# Patient Record
Sex: Female | Born: 1945 | Race: White | Hispanic: No | Smoking: Former smoker
Health system: Southern US, Community
[De-identification: ages and names within clinical notes are randomized; demographics above are authoritative.]

## PROBLEM LIST (undated history)

## (undated) DIAGNOSIS — M1990 Unspecified osteoarthritis, unspecified site: Secondary | ICD-10-CM

## (undated) DIAGNOSIS — G709 Myoneural disorder, unspecified: Secondary | ICD-10-CM

## (undated) DIAGNOSIS — L900 Lichen sclerosus et atrophicus: Secondary | ICD-10-CM

## (undated) DIAGNOSIS — R519 Headache, unspecified: Secondary | ICD-10-CM

## (undated) DIAGNOSIS — R112 Nausea with vomiting, unspecified: Secondary | ICD-10-CM

## (undated) DIAGNOSIS — Z9889 Other specified postprocedural states: Secondary | ICD-10-CM

## (undated) HISTORY — PX: TUBAL LIGATION: SHX77

## (undated) HISTORY — DX: Lichen sclerosus et atrophicus: L90.0

## (undated) HISTORY — PX: DILATION AND CURETTAGE OF UTERUS: SHX78

## (undated) HISTORY — PX: TONSILLECTOMY: SUR1361

## (undated) HISTORY — PX: EYE SURGERY: SHX253

---

## 2000-07-10 ENCOUNTER — Ambulatory Visit (HOSPITAL_COMMUNITY): Admission: RE | Admit: 2000-07-10 | Discharge: 2000-07-10 | Payer: Self-pay | Admitting: *Deleted

## 2000-07-10 ENCOUNTER — Encounter: Payer: Self-pay | Admitting: *Deleted

## 2001-07-18 ENCOUNTER — Ambulatory Visit (HOSPITAL_COMMUNITY): Admission: RE | Admit: 2001-07-18 | Discharge: 2001-07-18 | Payer: Self-pay | Admitting: *Deleted

## 2001-07-18 ENCOUNTER — Encounter: Payer: Self-pay | Admitting: *Deleted

## 2002-07-21 ENCOUNTER — Ambulatory Visit (HOSPITAL_COMMUNITY): Admission: RE | Admit: 2002-07-21 | Discharge: 2002-07-21 | Payer: Self-pay | Admitting: *Deleted

## 2002-07-21 ENCOUNTER — Encounter: Payer: Self-pay | Admitting: *Deleted

## 2002-12-08 ENCOUNTER — Ambulatory Visit (HOSPITAL_COMMUNITY): Admission: RE | Admit: 2002-12-08 | Discharge: 2002-12-08 | Payer: Self-pay | Admitting: Family Medicine

## 2002-12-10 ENCOUNTER — Ambulatory Visit (HOSPITAL_COMMUNITY): Admission: RE | Admit: 2002-12-10 | Discharge: 2002-12-10 | Payer: Self-pay | Admitting: Family Medicine

## 2003-02-03 ENCOUNTER — Other Ambulatory Visit: Admission: RE | Admit: 2003-02-03 | Discharge: 2003-02-03 | Payer: Self-pay | Admitting: *Deleted

## 2003-07-22 ENCOUNTER — Ambulatory Visit (HOSPITAL_COMMUNITY): Admission: RE | Admit: 2003-07-22 | Discharge: 2003-07-22 | Payer: Self-pay | Admitting: *Deleted

## 2004-07-25 ENCOUNTER — Ambulatory Visit (HOSPITAL_COMMUNITY): Admission: RE | Admit: 2004-07-25 | Discharge: 2004-07-25 | Payer: Self-pay | Admitting: *Deleted

## 2004-08-17 ENCOUNTER — Ambulatory Visit (HOSPITAL_COMMUNITY): Admission: RE | Admit: 2004-08-17 | Discharge: 2004-08-17 | Payer: Self-pay | Admitting: *Deleted

## 2005-12-28 ENCOUNTER — Ambulatory Visit: Payer: Self-pay | Admitting: Internal Medicine

## 2005-12-28 ENCOUNTER — Encounter (INDEPENDENT_AMBULATORY_CARE_PROVIDER_SITE_OTHER): Payer: Self-pay | Admitting: Specialist

## 2005-12-28 ENCOUNTER — Ambulatory Visit (HOSPITAL_COMMUNITY): Admission: RE | Admit: 2005-12-28 | Discharge: 2005-12-28 | Payer: Self-pay | Admitting: Internal Medicine

## 2006-02-28 ENCOUNTER — Ambulatory Visit (HOSPITAL_COMMUNITY): Admission: RE | Admit: 2006-02-28 | Discharge: 2006-02-28 | Payer: Self-pay | Admitting: Internal Medicine

## 2006-04-24 ENCOUNTER — Ambulatory Visit (HOSPITAL_COMMUNITY): Admission: RE | Admit: 2006-04-24 | Discharge: 2006-04-24 | Payer: Self-pay | Admitting: Internal Medicine

## 2006-05-02 ENCOUNTER — Ambulatory Visit (HOSPITAL_COMMUNITY): Admission: RE | Admit: 2006-05-02 | Discharge: 2006-05-02 | Payer: Self-pay | Admitting: Internal Medicine

## 2007-03-08 ENCOUNTER — Ambulatory Visit (HOSPITAL_COMMUNITY): Admission: RE | Admit: 2007-03-08 | Discharge: 2007-03-08 | Payer: Self-pay | Admitting: Internal Medicine

## 2007-05-15 ENCOUNTER — Ambulatory Visit (HOSPITAL_COMMUNITY): Admission: RE | Admit: 2007-05-15 | Discharge: 2007-05-15 | Payer: Self-pay | Admitting: Neurosurgery

## 2007-05-20 ENCOUNTER — Encounter (HOSPITAL_COMMUNITY): Admission: RE | Admit: 2007-05-20 | Discharge: 2007-06-19 | Payer: Self-pay | Admitting: Neurosurgery

## 2007-06-18 ENCOUNTER — Encounter: Admission: RE | Admit: 2007-06-18 | Discharge: 2007-06-18 | Payer: Self-pay | Admitting: Neurosurgery

## 2008-03-09 ENCOUNTER — Ambulatory Visit (HOSPITAL_COMMUNITY): Admission: RE | Admit: 2008-03-09 | Discharge: 2008-03-09 | Payer: Self-pay | Admitting: Internal Medicine

## 2008-09-16 ENCOUNTER — Ambulatory Visit (HOSPITAL_COMMUNITY): Admission: RE | Admit: 2008-09-16 | Discharge: 2008-09-16 | Payer: Self-pay | Admitting: Neurosurgery

## 2009-03-11 ENCOUNTER — Ambulatory Visit (HOSPITAL_COMMUNITY): Admission: RE | Admit: 2009-03-11 | Discharge: 2009-03-11 | Payer: Self-pay | Admitting: Internal Medicine

## 2010-02-13 ENCOUNTER — Encounter: Payer: Self-pay | Admitting: Pulmonary Disease

## 2010-02-14 ENCOUNTER — Encounter: Payer: Self-pay | Admitting: Neurosurgery

## 2010-03-02 ENCOUNTER — Other Ambulatory Visit (HOSPITAL_COMMUNITY): Payer: Self-pay | Admitting: Internal Medicine

## 2010-03-02 DIAGNOSIS — Z139 Encounter for screening, unspecified: Secondary | ICD-10-CM

## 2010-03-14 ENCOUNTER — Ambulatory Visit (HOSPITAL_COMMUNITY): Payer: Medicare Other

## 2010-03-14 ENCOUNTER — Ambulatory Visit (HOSPITAL_COMMUNITY)
Admission: RE | Admit: 2010-03-14 | Discharge: 2010-03-14 | Disposition: A | Discharge status: Home / Self Care | Payer: Medicare Other | Source: Ambulatory Visit | Attending: Internal Medicine | Admitting: Internal Medicine

## 2010-03-14 DIAGNOSIS — Z1231 Encounter for screening mammogram for malignant neoplasm of breast: Secondary | ICD-10-CM | POA: Insufficient documentation

## 2010-03-14 DIAGNOSIS — Z139 Encounter for screening, unspecified: Secondary | ICD-10-CM

## 2010-03-16 ENCOUNTER — Other Ambulatory Visit (HOSPITAL_COMMUNITY): Payer: Self-pay | Admitting: Physical Medicine and Rehabilitation

## 2010-03-16 DIAGNOSIS — M542 Cervicalgia: Secondary | ICD-10-CM

## 2010-03-16 DIAGNOSIS — M545 Low back pain, unspecified: Secondary | ICD-10-CM

## 2010-03-21 ENCOUNTER — Ambulatory Visit (HOSPITAL_COMMUNITY): Payer: Medicare Other

## 2010-03-28 ENCOUNTER — Ambulatory Visit (HOSPITAL_COMMUNITY): Payer: Medicare Other

## 2010-03-31 ENCOUNTER — Ambulatory Visit (HOSPITAL_COMMUNITY): Payer: Medicare Other

## 2010-04-07 ENCOUNTER — Ambulatory Visit (HOSPITAL_COMMUNITY)
Admission: RE | Admit: 2010-04-07 | Discharge: 2010-04-07 | Disposition: A | Discharge status: Home / Self Care | Payer: Medicare Other | Source: Ambulatory Visit | Attending: Physical Medicine and Rehabilitation | Admitting: Physical Medicine and Rehabilitation

## 2010-04-07 DIAGNOSIS — M542 Cervicalgia: Secondary | ICD-10-CM | POA: Insufficient documentation

## 2010-04-07 DIAGNOSIS — M545 Low back pain, unspecified: Secondary | ICD-10-CM

## 2010-04-07 DIAGNOSIS — M5030 Other cervical disc degeneration, unspecified cervical region: Secondary | ICD-10-CM | POA: Insufficient documentation

## 2010-04-07 DIAGNOSIS — R209 Unspecified disturbances of skin sensation: Secondary | ICD-10-CM | POA: Insufficient documentation

## 2010-04-07 DIAGNOSIS — M5380 Other specified dorsopathies, site unspecified: Secondary | ICD-10-CM | POA: Insufficient documentation

## 2010-06-10 NOTE — Op Note (Signed)
NAMEVALLORIE, NICCOLI                 ACCOUNT NO.:  [id number]   MEDICAL RECORD NO.:  [id number]          PATIENT TYPE:  AMB   LOCATION:  DAY                           FACILITY:  [facility]   PHYSICIAN:  R. Roetta Sessions, M.D. DATE OF BIRTH:  [birth year]   DATE OF PROCEDURE:  12/28/2005  DATE OF DISCHARGE:                               OPERATIVE REPORT   PROCEDURE:  Colonoscopy with biopsy.   INDICATIONS FOR PROCEDURE:  Patient is a 65 year old lady devoid of any  lower GI tract symptoms, sent over courtesy of Dr. Ouida Sills for colorectal  cancer screening.  She has never had her lower GI tract imaged.   FAMILY HISTORY:  Significant in that her father passed away with  colorectal cancer on his early 39s.  Colonoscopy is now being done as  high risk screening maneuvers.  This approach has discussed the patient  at length.  Potential risks, benefits and was have been reviewed,  questions answered.  Agreeable. Please see documentation in the medical  record.   PROCEDURE NOTE:  O2 saturation, blood pressure, pulse, respirations  monitored throughout the entire procedure.   CONSCIOUS SEDATION:  Versed 4 mg IV, Demerol 75 mg IV divided doses.   INSTRUMENT:  Olympus video chip system, adult and pediatric scope.   FINDINGS:  Digital rectal exam revealed no abnormalities.   ENDOSCOPIC FINDINGS:  The prep was adequate.  Colon:  Colonic mucosa was surveyed from rectosigmoid junction through  the left, transverse, right colon to area appendiceal orifice, ileocecal  valve and cecum.  These structures well seen photographed for the  record.  From this level scope was withdrawn.  All previously mentioned  mucosal surfaces were again seen.  I initially encountered an acute  flexure in the sigmoid colon, which I could not overcome with the adult  scope.  I obtained a pediatric scope and was easily able to advance  scope to the cecum.  The colonic mucosa appeared normal.  Pediatric  scope was pulled down  into the rectum.  Thorough examination of the  rectal mucosa fossa en face and retroflex was undertaken.  The patient  had a couple anal papilla and some internal hemorrhoids.  A diminutive 3  mm polyp at 3 cm in from anal verge was cold biopsied/removed.  Rectal  mucosa appeared normal.  The patient tolerated the procedure well, was  reacted in endoscopy.   IMPRESSION:  Anal papilla and internal hemorrhoids cold  biopsied/removed, remainder of rectal mucosa appeared normal, normal  colonic mucosa.   RECOMMENDATIONS:  1. Follow-up on path.  2. Further recommendations to follow.      Jonathon Bellows, M.D.  Electronically Signed     RMR/MEDQ  D:  12/28/2005  T:  12/28/2005  Job:  [id number]   cc:   Kingsley Callander. Ouida Sills, MD  Fax: [phone]

## 2010-08-11 ENCOUNTER — Encounter (HOSPITAL_COMMUNITY): Payer: Self-pay

## 2010-08-11 ENCOUNTER — Encounter (HOSPITAL_COMMUNITY)
Admission: RE | Admit: 2010-08-11 | Discharge: 2010-08-11 | Disposition: A | Discharge status: Home / Self Care | Payer: Medicare Other | Source: Ambulatory Visit | Attending: Ophthalmology | Admitting: Ophthalmology

## 2010-08-11 ENCOUNTER — Other Ambulatory Visit: Payer: Self-pay

## 2010-08-11 HISTORY — DX: Nausea with vomiting, unspecified: R11.2

## 2010-08-11 HISTORY — DX: Other specified postprocedural states: Z98.890

## 2010-08-11 LAB — CBC
HCT: 37.6 % (ref 36.0–46.0)
Hemoglobin: 12.9 g/dL (ref 12.0–15.0)
MCH: 31.5 pg (ref 26.0–34.0)
MCHC: 34.3 g/dL (ref 30.0–36.0)
MCV: 91.7 fL (ref 78.0–100.0)
Platelets: 255 10*3/uL (ref 150–400)
RBC: 4.1 MIL/uL (ref 3.87–5.11)
RDW: 12.2 % (ref 11.5–15.5)
WBC: 7.2 10*3/uL (ref 4.0–10.5)

## 2010-08-11 LAB — BASIC METABOLIC PANEL
BUN: 7 mg/dL (ref 6–23)
CO2: 26 mEq/L (ref 19–32)
Calcium: 9.8 mg/dL (ref 8.4–10.5)
Chloride: 101 mEq/L (ref 96–112)
Creatinine, Ser: 0.59 mg/dL (ref 0.50–1.10)
GFR calc Af Amer: >60 mL/min (ref >60)
GFR calc non Af Amer: >60 mL/min (ref >60)
Glucose, Bld: 112 mg/dL — ABNORMAL HIGH (ref 70–99)
Potassium: 3.9 mEq/L (ref 3.5–5.1)
Sodium: 136 mEq/L (ref 135–145)

## 2010-08-11 NOTE — Patient Instructions (Signed)
20 Casey Armstrong  08/11/2010   Your procedure is scheduled on: 08/15/10  Report to [facility] at 0900 AM  Call this number if you have problems the morning of surgery: [phone]   Remember:   Do not eat food:After Midnight.  Do not drink clear liquids: After Midnight.  Take these medicines the morning of surgery with A SIP OF WATER: ativan if needed   Do not wear jewelry, make-up or nail polish.  Do not bring valuables to the hospital.  Contacts, dentures or bridgework may not be worn into surgery.  Leave suitcase in the car. After surgery it may be brought to your room.  For patients admitted to the hospital, checkout time is 11:00 AM the day of discharge.   Patients discharged the day of surgery will not be allowed to drive home.  Name and phone number of your driver: family  Special Instructions: N/A   Please read over the following fact sheets that you were given: Pain Booklet and Anesthesia Post-op Instructions PATIENT INSTRUCTIONS POST-ANESTHESIA  IMMEDIATELY FOLLOWING SURGERY:  Do not drive or operate machinery for the first twenty four hours after surgery.  Do not make any important decisions for twenty four hours after surgery or while taking narcotic pain medications or sedatives.  If you develop intractable nausea and vomiting or a severe headache please notify your doctor immediately.  FOLLOW-UP:  Please make an appointment with your surgeon as instructed. You do not need to follow up with anesthesia unless specifically instructed to do so.  WOUND CARE INSTRUCTIONS (if applicable):  Keep a dry clean dressing on the anesthesia/puncture wound site if there is drainage.  Once the wound has quit draining you may leave it open to air.  Generally you should leave the bandage intact for twenty four hours unless there is drainage.  If the epidural site drains for more than 36-48 hours please call the anesthesia department.  QUESTIONS?:  Please feel free to call your physician or the  hospital operator if you have any questions, and they will be happy to assist you.     [facility] Anesthesia Department [address] [place] [phone]

## 2010-08-15 ENCOUNTER — Ambulatory Visit (HOSPITAL_COMMUNITY): Payer: Medicare Other | Admitting: Anesthesiology

## 2010-08-15 ENCOUNTER — Encounter (HOSPITAL_COMMUNITY): Payer: Self-pay | Admitting: Anesthesiology

## 2010-08-15 ENCOUNTER — Ambulatory Visit (HOSPITAL_COMMUNITY)
Admission: RE | Admit: 2010-08-15 | Discharge: 2010-08-15 | Disposition: A | Discharge status: Home / Self Care | Payer: Medicare Other | Source: Ambulatory Visit | Attending: Ophthalmology | Admitting: Ophthalmology

## 2010-08-15 ENCOUNTER — Encounter (HOSPITAL_COMMUNITY)
Admission: RE | Disposition: A | Discharge status: Home / Self Care | Payer: Self-pay | Source: Ambulatory Visit | Attending: Ophthalmology

## 2010-08-15 DIAGNOSIS — H2510 Age-related nuclear cataract, unspecified eye: Secondary | ICD-10-CM | POA: Insufficient documentation

## 2010-08-15 DIAGNOSIS — Z01818 Encounter for other preprocedural examination: Secondary | ICD-10-CM | POA: Insufficient documentation

## 2010-08-15 HISTORY — PX: CATARACT EXTRACTION W/PHACO: SHX586

## 2010-08-15 SURGERY — PHACOEMULSIFICATION, CATARACT, WITH IOL INSERTION
Anesthesia: Monitor Anesthesia Care | Site: Eye | Laterality: Left | Wound class: Clean

## 2010-08-15 MED ORDER — CYCLOPENTOLATE-PHENYLEPHRINE 0.2-1 % OP SOLN
1.0000 [drp] | OPHTHALMIC | Status: AC
  Administered 2010-08-15 (×3): 1 [drp] via OPHTHALMIC

## 2010-08-15 MED ORDER — MIDAZOLAM HCL 2 MG/2ML IJ SOLN: 1.0000 mg | INTRAMUSCULAR | Status: DC | PRN

## 2010-08-15 MED ORDER — TETRACAINE HCL 0.5 % OP SOLN
OPHTHALMIC | Status: AC
  Administered 2010-08-15: 1 [drp] via OPHTHALMIC
  Filled 2010-08-15: qty 2

## 2010-08-15 MED ORDER — PHENYLEPHRINE HCL 2.5 % OP SOLN
1.0000 [drp] | OPHTHALMIC | Status: AC
  Administered 2010-08-15 (×3): 1 [drp] via OPHTHALMIC

## 2010-08-15 MED ORDER — PROVISC 10 MG/ML IO SOLN
INTRAOCULAR | Status: DC | PRN
  Administered 2010-08-15: 8.5 mg via OPHTHALMIC

## 2010-08-15 MED ORDER — EPINEPHRINE HCL 1 MG/ML IJ SOLN
INTRAOCULAR | Status: DC | PRN
  Administered 2010-08-15: 11:00:00

## 2010-08-15 MED ORDER — LACTATED RINGERS IV SOLN
INTRAVENOUS | Status: DC
  Administered 2010-08-15: 50 mL via INTRAVENOUS

## 2010-08-15 MED ORDER — NEOMYCIN-POLYMYXIN-DEXAMETH 0.1 % OP OINT
TOPICAL_OINTMENT | OPHTHALMIC | Status: DC | PRN
  Administered 2010-08-15: 1 via OPHTHALMIC

## 2010-08-15 MED ORDER — EPINEPHRINE HCL 1 MG/ML IJ SOLN
INTRAMUSCULAR | Status: AC
  Filled 2010-08-15: qty 1

## 2010-08-15 MED ORDER — LIDOCAINE HCL (PF) 1 % IJ SOLN
INTRAMUSCULAR | Status: AC
  Filled 2010-08-15: qty 2

## 2010-08-15 MED ORDER — LIDOCAINE HCL (PF) 1 % IJ SOLN
INTRAMUSCULAR | Status: DC | PRN
  Administered 2010-08-15: .2 mL

## 2010-08-15 MED ORDER — TETRACAINE HCL 0.5 % OP SOLN
1.0000 [drp] | OPHTHALMIC | Status: AC
  Administered 2010-08-15 (×3): 1 [drp] via OPHTHALMIC

## 2010-08-15 MED ORDER — NEOMYCIN-POLYMYXIN-DEXAMETH 3.5-10000-0.1 OP OINT
TOPICAL_OINTMENT | OPHTHALMIC | Status: AC
  Filled 2010-08-15: qty 3.5

## 2010-08-15 MED ORDER — POVIDONE-IODINE 5 % OP SOLN
OPHTHALMIC | Status: DC | PRN
  Administered 2010-08-15: 1 via OPHTHALMIC

## 2010-08-15 MED ORDER — CYCLOPENTOLATE-PHENYLEPHRINE 0.2-1 % OP SOLN
OPHTHALMIC | Status: AC
  Administered 2010-08-15: 1 [drp] via OPHTHALMIC
  Filled 2010-08-15: qty 2

## 2010-08-15 MED ORDER — LACTATED RINGERS IV SOLN
INTRAVENOUS | Status: DC | PRN
  Administered 2010-08-15: 11:00:00 via INTRAVENOUS

## 2010-08-15 MED ORDER — LIDOCAINE HCL 3.5 % OP GEL
OPHTHALMIC | Status: DC | PRN
  Administered 2010-08-15: 1 via OPHTHALMIC

## 2010-08-15 MED ORDER — LIDOCAINE HCL 3.5 % OP GEL
OPHTHALMIC | Status: AC
  Filled 2010-08-15: qty 5

## 2010-08-15 MED ORDER — LIDOCAINE HCL 3.5 % OP GEL: 1.0000 "application " | Freq: Once | OPHTHALMIC | Status: DC

## 2010-08-15 MED ORDER — MIDAZOLAM HCL 2 MG/2ML IJ SOLN
INTRAMUSCULAR | Status: AC
  Filled 2010-08-15: qty 2

## 2010-08-15 MED ORDER — BSS IO SOLN
INTRAOCULAR | Status: DC | PRN
  Administered 2010-08-15: 15 mL via OPHTHALMIC

## 2010-08-15 MED ORDER — PHENYLEPHRINE HCL 2.5 % OP SOLN
OPHTHALMIC | Status: AC
  Administered 2010-08-15: 1 [drp] via OPHTHALMIC
  Filled 2010-08-15: qty 2

## 2010-08-15 SURGICAL SUPPLY — 32 items
CAPSULAR TENSION RING-AMO (OPHTHALMIC RELATED) IMPLANT
CLOTH BEACON ORANGE TIMEOUT ST (SAFETY) ×1 IMPLANT
DUOVISC SYSTEM (INTRAOCULAR LENS)
EYE SHIELD UNIVERSAL CLEAR (GAUZE/BANDAGES/DRESSINGS) ×1 IMPLANT
GLOVE BIO SURGEON STRL SZ 6.5 (GLOVE) IMPLANT
GLOVE BIOGEL PI IND STRL 6.5 (GLOVE) IMPLANT
GLOVE BIOGEL PI IND STRL 7.0 (GLOVE) IMPLANT
GLOVE BIOGEL PI IND STRL 7.5 (GLOVE) IMPLANT
GLOVE BIOGEL PI INDICATOR 6.5 (GLOVE)
GLOVE BIOGEL PI INDICATOR 7.0 (GLOVE) ×1
GLOVE BIOGEL PI INDICATOR 7.5 (GLOVE)
GLOVE ECLIPSE 6.5 STRL STRAW (GLOVE) IMPLANT
GLOVE ECLIPSE 7.0 STRL STRAW (GLOVE) IMPLANT
GLOVE ECLIPSE 7.5 STRL STRAW (GLOVE) IMPLANT
GLOVE EXAM NITRILE LRG STRL (GLOVE) IMPLANT
GLOVE EXAM NITRILE MD LF STRL (GLOVE) ×1 IMPLANT
GLOVE SKINSENSE NS SZ6.5 (GLOVE)
GLOVE SKINSENSE NS SZ7.0 (GLOVE)
GLOVE SKINSENSE STRL SZ6.5 (GLOVE) IMPLANT
GLOVE SKINSENSE STRL SZ7.0 (GLOVE) IMPLANT
KIT VITRECTOMY (OPHTHALMIC RELATED) IMPLANT
PAD ARMBOARD 7.5X6 YLW CONV (MISCELLANEOUS) ×1 IMPLANT
PROC W NO LENS (INTRAOCULAR LENS)
PROC W SPEC LENS (INTRAOCULAR LENS)
PROCESS W NO LENS (INTRAOCULAR LENS) IMPLANT
PROCESS W SPEC LENS (INTRAOCULAR LENS) IMPLANT
RING MALYGIN (MISCELLANEOUS) IMPLANT
SIGHTPATH CAT PROC W REG LENS (Ophthalmic Related) ×2 IMPLANT
SYR TB 1ML LL NO SAFETY (SYRINGE) ×1 IMPLANT
SYSTEM DUOVISC (INTRAOCULAR LENS) IMPLANT
VISCOELASTIC ADDITIONAL (OPHTHALMIC RELATED) IMPLANT
WATER STERILE IRR 250ML POUR (IV SOLUTION) ×1 IMPLANT

## 2010-08-15 NOTE — H&P (Signed)
I have evaluated the patient preoperatively, and have identified no interval changes in medical condition and plan of care since the history and physical of record 

## 2010-08-15 NOTE — Transfer of Care (Signed)
Immediate Anesthesia Transfer of Care Note  Patient: Casey Armstrong  Procedure(s) Performed:  CATARACT EXTRACTION PHACO AND INTRAOCULAR LENS PLACEMENT (IOC) - CDE 14.67  Patient Location: Short Stay  Anesthesia Type: MAC  Level of Consciousness: awake  Airway & Oxygen Therapy: Patient Spontanous Breathing  Post-op Assessment: Report given to PACU RN  Post vital signs: stable  Complications: No apparent anesthesia complications

## 2010-08-15 NOTE — Anesthesia Preprocedure Evaluation (Addendum)
Anesthesia Evaluation  Name, MR# and DOB Patient awake  General Assessment Comment  Reviewed: Allergy & Precautions and H&P   History of Anesthesia Complications (+) PONV  Airway Mallampati: I  Neck ROM: Full    Dental  (+) Teeth Intact   Pulmonary  clear to auscultation    Cardiovascular Regular Normal   Neuro/Psych  GI/Hepatic/Renal   Endo/Other   Abdominal   Musculoskeletal  Hematology   Peds  Reproductive/Obstetrics   Anesthesia Other Findings             Anesthesia Physical Anesthesia Plan  ASA: I  Anesthesia Plan: MAC   Post-op Pain Management:    Induction:   Airway Management Planned: Nasal Cannula  Additional Equipment:   Intra-op Plan:   Post-operative Plan:   Informed Consent: I have reviewed the patients History and Physical, chart, labs and discussed the procedure including the risks, benefits and alternatives for the proposed anesthesia with the patient or authorized representative who has indicated his/her understanding and acceptance.     Plan Discussed with:   Anesthesia Plan Comments:         Anesthesia Quick Evaluation

## 2010-08-15 NOTE — Anesthesia Postprocedure Evaluation (Signed)
  Anesthesia Post-op Note  Patient: Casey Armstrong  Procedure(s) Performed:  CATARACT EXTRACTION PHACO AND INTRAOCULAR LENS PLACEMENT (IOC) - CDE 14.67  Patient Location: PACU and Short Stay  Anesthesia Type: MAC  Level of Consciousness: awake, alert  and oriented  Airway and Oxygen Therapy: Patient Spontanous Breathing  Post-op Pain: none  Post-op Assessment: Post-op Vital signs reviewed, Patient's Cardiovascular Status Stable and Respiratory Function Stable  Post-op Vital Signs: stable  Complications: No apparent anesthesia complications

## 2010-08-15 NOTE — Brief Op Note (Signed)
08/15/2010  11:43 AM  PATIENT:  Casey Armstrong  65 y.o. female  PRE-OPERATIVE DIAGNOSIS:  nuclear cataract left eye  POST-OPERATIVE DIAGNOSIS:  nuclear cataract left eye  PROCEDURE:  Procedure(s): CATARACT EXTRACTION PHACO AND INTRAOCULAR LENS PLACEMENT (IOC)  SURGEON:  Surgeon(s): Gemma Payor  ANESTHESIA:   local and IV sedation

## 2010-08-16 NOTE — Op Note (Signed)
NAMELORMA, HEATER                 ACCOUNT NO.:  [id number]  MEDICAL RECORD NO.:  [id number]  LOCATION:  APPO                          FACILITY:  [facility]  PHYSICIAN:  Susanne Greenhouse, MD       DATE OF BIRTH:  April 08, 1945  DATE OF PROCEDURE:  08/15/2010 DATE OF DISCHARGE:  08/15/2010                              OPERATIVE REPORT   PREOPERATIVE DIAGNOSIS:  Nuclear cataract, left eye; diagnosis code 366.16.  POSTOPERATIVE DIAGNOSIS:  Nuclear cataract, left eye; diagnosis code 366.16.  PROCEDURE: PHACOEMULSIFICATION WITH IOL OS.  SPECIMENS:  No surgical specimens.  PROSTHETIC DEVICE USED:  Lenstec posterior chamber lens, model Softec HD, power of 29.0, serial number is [id number].          ______________________________ Susanne Greenhouse, MD     KEH/MEDQ  D:  08/15/2010  T:  08/15/2010  Job:  [id number]

## 2010-08-23 ENCOUNTER — Encounter (HOSPITAL_COMMUNITY): Payer: Self-pay | Admitting: *Deleted

## 2010-08-23 ENCOUNTER — Ambulatory Visit (HOSPITAL_COMMUNITY)
Admission: RE | Admit: 2010-08-23 | Discharge: 2010-08-23 | Payer: Medicare Other | Source: Ambulatory Visit | Attending: Ophthalmology | Admitting: Ophthalmology

## 2010-08-23 MED ORDER — LACTATED RINGERS IV SOLN: INTRAVENOUS | Status: DC

## 2010-08-25 ENCOUNTER — Ambulatory Visit (HOSPITAL_COMMUNITY): Payer: Medicare Other | Admitting: Anesthesiology

## 2010-08-25 ENCOUNTER — Encounter (HOSPITAL_COMMUNITY): Payer: Self-pay | Admitting: Anesthesiology

## 2010-08-25 ENCOUNTER — Encounter (HOSPITAL_COMMUNITY): Payer: Self-pay | Admitting: *Deleted

## 2010-08-25 ENCOUNTER — Encounter (HOSPITAL_COMMUNITY)
Admission: RE | Disposition: A | Discharge status: Home / Self Care | Payer: Self-pay | Source: Ambulatory Visit | Attending: Ophthalmology

## 2010-08-25 ENCOUNTER — Ambulatory Visit (HOSPITAL_COMMUNITY)
Admission: RE | Admit: 2010-08-25 | Discharge: 2010-08-25 | Disposition: A | Discharge status: Home / Self Care | Payer: Medicare Other | Source: Ambulatory Visit | Attending: Ophthalmology | Admitting: Ophthalmology

## 2010-08-25 DIAGNOSIS — H2510 Age-related nuclear cataract, unspecified eye: Secondary | ICD-10-CM | POA: Insufficient documentation

## 2010-08-25 HISTORY — PX: CATARACT EXTRACTION W/PHACO: SHX586

## 2010-08-25 SURGERY — PHACOEMULSIFICATION, CATARACT, WITH IOL INSERTION
Anesthesia: Monitor Anesthesia Care | Site: Eye | Laterality: Right | Wound class: Clean

## 2010-08-25 MED ORDER — CYCLOPENTOLATE-PHENYLEPHRINE 0.2-1 % OP SOLN: 1.0000 [drp] | OPHTHALMIC | Status: DC

## 2010-08-25 MED ORDER — MIDAZOLAM HCL 2 MG/2ML IJ SOLN
INTRAMUSCULAR | Status: AC
  Filled 2010-08-25: qty 2

## 2010-08-25 MED ORDER — CYCLOPENTOLATE-PHENYLEPHRINE 0.2-1 % OP SOLN
OPHTHALMIC | Status: AC
  Administered 2010-08-25: 1 [drp] via OPHTHALMIC
  Filled 2010-08-25: qty 2

## 2010-08-25 MED ORDER — LIDOCAINE HCL 3.5 % OP GEL: 1.0000 "application " | Freq: Once | OPHTHALMIC | Status: DC

## 2010-08-25 MED ORDER — EPINEPHRINE HCL 1 MG/ML IJ SOLN
INTRAMUSCULAR | Status: DC | PRN
  Administered 2010-08-25: 10:00:00

## 2010-08-25 MED ORDER — LIDOCAINE HCL (PF) 1 % IJ SOLN
INTRAMUSCULAR | Status: DC | PRN
  Administered 2010-08-25: .2 mL

## 2010-08-25 MED ORDER — TETRACAINE HCL 0.5 % OP SOLN
OPHTHALMIC | Status: AC
  Administered 2010-08-25: 1 [drp] via OPHTHALMIC
  Filled 2010-08-25: qty 2

## 2010-08-25 MED ORDER — LIDOCAINE HCL 3.5 % OP GEL
OPHTHALMIC | Status: AC
  Administered 2010-08-25: 1 via OPHTHALMIC
  Filled 2010-08-25: qty 5

## 2010-08-25 MED ORDER — NEOMYCIN-POLYMYXIN-DEXAMETH 0.1 % OP OINT
TOPICAL_OINTMENT | OPHTHALMIC | Status: DC | PRN
  Administered 2010-08-25: 1 via OPHTHALMIC

## 2010-08-25 MED ORDER — TETRACAINE HCL 0.5 % OP SOLN: 1.0000 [drp] | OPHTHALMIC | Status: DC

## 2010-08-25 MED ORDER — TETRACAINE HCL 0.5 % OP SOLN
1.0000 [drp] | OPHTHALMIC | Status: AC
  Administered 2010-08-25 (×3): 1 [drp] via OPHTHALMIC

## 2010-08-25 MED ORDER — PHENYLEPHRINE HCL 2.5 % OP SOLN
OPHTHALMIC | Status: AC
  Administered 2010-08-25: 1 [drp] via OPHTHALMIC
  Filled 2010-08-25: qty 2

## 2010-08-25 MED ORDER — PHENYLEPHRINE HCL 2.5 % OP SOLN
1.0000 [drp] | OPHTHALMIC | Status: AC
  Administered 2010-08-25 (×3): 1 [drp] via OPHTHALMIC

## 2010-08-25 MED ORDER — LIDOCAINE HCL (PF) 1 % IJ SOLN
INTRAMUSCULAR | Status: AC
  Filled 2010-08-25: qty 2

## 2010-08-25 MED ORDER — POVIDONE-IODINE 5 % OP SOLN
OPHTHALMIC | Status: DC | PRN
  Administered 2010-08-25: 1 via OPHTHALMIC

## 2010-08-25 MED ORDER — LACTATED RINGERS IV SOLN
INTRAVENOUS | Status: DC | PRN
  Administered 2010-08-25: 09:00:00 via INTRAVENOUS

## 2010-08-25 MED ORDER — BSS IO SOLN
INTRAOCULAR | Status: DC | PRN
  Administered 2010-08-25: 15 mL via OPHTHALMIC

## 2010-08-25 MED ORDER — LIDOCAINE HCL 3.5 % OP GEL
1.0000 "application " | Freq: Once | OPHTHALMIC | Status: AC
  Administered 2010-08-25: 1 via OPHTHALMIC

## 2010-08-25 MED ORDER — CYCLOPENTOLATE-PHENYLEPHRINE 0.2-1 % OP SOLN
1.0000 [drp] | OPHTHALMIC | Status: AC
  Administered 2010-08-25 (×3): 1 [drp] via OPHTHALMIC

## 2010-08-25 MED ORDER — PHENYLEPHRINE HCL 2.5 % OP SOLN: 1.0000 [drp] | OPHTHALMIC | Status: DC

## 2010-08-25 MED ORDER — NEOMYCIN-POLYMYXIN-DEXAMETH 3.5-10000-0.1 OP OINT
TOPICAL_OINTMENT | OPHTHALMIC | Status: AC
  Filled 2010-08-25: qty 3.5

## 2010-08-25 MED ORDER — MIDAZOLAM HCL 2 MG/2ML IJ SOLN
1.0000 mg | INTRAMUSCULAR | Status: DC | PRN
Start: 2010-08-25 — End: 2010-08-25
  Administered 2010-08-25: 2 mg via INTRAVENOUS

## 2010-08-25 MED ORDER — PROVISC 10 MG/ML IO SOLN
INTRAOCULAR | Status: DC | PRN
  Administered 2010-08-25: 8.5 mg via OPHTHALMIC

## 2010-08-25 MED ORDER — LIDOCAINE HCL 3.5 % OP GEL
OPHTHALMIC | Status: DC | PRN
  Administered 2010-08-25: 1 via OPHTHALMIC

## 2010-08-25 SURGICAL SUPPLY — 34 items
CAPSULAR TENSION RING-AMO (OPHTHALMIC RELATED) IMPLANT
CLOTH BEACON ORANGE TIMEOUT ST (SAFETY) ×1 IMPLANT
DUOVISC SYSTEM (INTRAOCULAR LENS)
EYE SHIELD UNIVERSAL CLEAR (GAUZE/BANDAGES/DRESSINGS) ×1 IMPLANT
GLOVE BIO SURGEON STRL SZ 6.5 (GLOVE) IMPLANT
GLOVE BIOGEL PI IND STRL 6.5 (GLOVE) IMPLANT
GLOVE BIOGEL PI IND STRL 7.0 (GLOVE) IMPLANT
GLOVE BIOGEL PI IND STRL 7.5 (GLOVE) IMPLANT
GLOVE BIOGEL PI INDICATOR 6.5 (GLOVE) ×1
GLOVE BIOGEL PI INDICATOR 7.0 (GLOVE)
GLOVE BIOGEL PI INDICATOR 7.5 (GLOVE)
GLOVE ECLIPSE 6.5 STRL STRAW (GLOVE) IMPLANT
GLOVE ECLIPSE 7.0 STRL STRAW (GLOVE) IMPLANT
GLOVE ECLIPSE 7.5 STRL STRAW (GLOVE) IMPLANT
GLOVE EXAM NITRILE LRG STRL (GLOVE) ×1 IMPLANT
GLOVE EXAM NITRILE MD LF STRL (GLOVE) IMPLANT
GLOVE SKINSENSE NS SZ6.5 (GLOVE)
GLOVE SKINSENSE NS SZ7.0 (GLOVE)
GLOVE SKINSENSE STRL SZ6.5 (GLOVE) IMPLANT
GLOVE SKINSENSE STRL SZ7.0 (GLOVE) IMPLANT
KIT VITRECTOMY (OPHTHALMIC RELATED) IMPLANT
PAD ARMBOARD 7.5X6 YLW CONV (MISCELLANEOUS) ×1 IMPLANT
PROC W NO LENS (INTRAOCULAR LENS)
PROC W SPEC LENS (INTRAOCULAR LENS)
PROCESS W NO LENS (INTRAOCULAR LENS) IMPLANT
PROCESS W SPEC LENS (INTRAOCULAR LENS) IMPLANT
RING MALYGIN (MISCELLANEOUS) IMPLANT
SIGHTPATH CAT PROC W REG LENS (Ophthalmic Related) ×2 IMPLANT
SYR TB 1ML LL NO SAFETY (SYRINGE) ×1 IMPLANT
SYSTEM DUOVISC (INTRAOCULAR LENS) IMPLANT
TAPE SURG TRANSPORE 1 IN (GAUZE/BANDAGES/DRESSINGS) IMPLANT
TAPE SURGICAL TRANSPORE 1 IN (GAUZE/BANDAGES/DRESSINGS) ×1
VISCOELASTIC ADDITIONAL (OPHTHALMIC RELATED) IMPLANT
WATER STERILE IRR 250ML POUR (IV SOLUTION) ×1 IMPLANT

## 2010-08-25 NOTE — Op Note (Signed)
NAMEASHANI, PUMPHREY                 ACCOUNT NO.:  [id number]  MEDICAL RECORD NO.:  [id number]  LOCATION:  APPO                          FACILITY:  [facility]  PHYSICIAN:  Susanne Greenhouse, MD       DATE OF BIRTH:  03-08-1945  DATE OF PROCEDURE:  08/25/2010 DATE OF DISCHARGE:  08/25/2010                              OPERATIVE REPORT   PREOPERATIVE DIAGNOSIS:  Nuclear cataract, right eye.  Diagnosis code 366.16.  POSTOPERATIVE DIAGNOSIS:  Nuclear cataract, right eye.  Diagnosis code 366.16.  OPERATION PERFORMED:  Phacoemulsification with posterior chamber intraocular lens implantation, right eye.  SURGEON:  Susanne Greenhouse, MD  ANESTHESIA:  General endotracheal anesthesia.  OPERATIVE SUMMARY:  In the preoperative area, dilating drops were placed into the right eye.  The patient was then brought into the operating room where she was placed under general anesthesia.  The eye was then prepped and draped.  Beginning with a 75 blade, a paracentesis port was made at the surgeon's 2 o'clock position.  The anterior chamber was then filled with a 1% nonpreserved lidocaine solution with epinephrine.  This was followed by Viscoat to deepen the chamber.  A small fornix-based peritomy was performed superiorly.  Next, a single iris hook was placed through the limbus superiorly.  A 2.4-mm keratome blade was then used to make a clear corneal incision over the iris hook.  A bent cystotome needle and Utrata forceps were used to create a continuous tear capsulotomy.  Hydrodissection was performed using balanced salt solution on a fine cannula.  The lens nucleus was then removed using phacoemulsification in a quadrant cracking technique.  The cortical material was then removed with irrigation and aspiration.  The capsular bag and anterior chamber were refilled with Provisc.  The wound was widened to approximately 3 mm and a posterior chamber intraocular lens was placed into the capsular bag without difficulty  using an [employer] lens injecting system.  A single 10-0 nylon suture was then used to close the incision as well as stromal hydration.  The Provisc was removed from the anterior chamber and capsular bag with irrigation and aspiration.  At this point, the wounds were tested for leak, which were negative.  The anterior chamber remained deep and stable.  The patient tolerated the procedure well.  There were no operative complications, and she awoke from general anesthesia without problem.  No surgical specimens.  PROSTHETIC DEVICE USED:  A Lenstec posterior chamber lens model SofTec HD, power of 29.0, serial number is [id number].          ______________________________ Susanne Greenhouse, MD     KEH/MEDQ  D:  08/25/2010  T:  08/25/2010  Job:  [id number]

## 2010-08-25 NOTE — H&P (Signed)
I have evaluated the patient preoperatively, and have identified no interval changes in medical condition and plan of care since the history and physical of record 

## 2010-08-25 NOTE — Transfer of Care (Signed)
Immediate Anesthesia Transfer of Care Note  Patient: Casey Armstrong  Procedure(s) Performed:  CATARACT EXTRACTION PHACO AND INTRAOCULAR LENS PLACEMENT (IOC) - CDE 12.65  Patient Location: PACU and Short Stay  Anesthesia Type: MAC  Level of Consciousness: awake, alert , oriented and patient cooperative  Airway & Oxygen Therapy: Patient Spontanous Breathing  Post-op Assessment: Report given to PACU RN, Post -op Vital signs reviewed and stable and Patient moving all extremities X 4  Post vital signs: stable  Complications: No apparent anesthesia complications

## 2010-08-25 NOTE — Anesthesia Preprocedure Evaluation (Addendum)
Anesthesia Evaluation  General Assessment Comment  History of Anesthesia Complications (+) PONV  Airway Mallampati: I    Mouth opening: Limited Mouth Opening  Dental  (+) Teeth Intact   Pulmonary    pulmonary exam normal   Cardiovascular    Neuro/Psych  GI/Hepatic/Renal   Endo/Other   Abdominal   Musculoskeletal  Hematology   Peds  Reproductive/Obstetrics   Anesthesia Other Findings             Anesthesia Physical Anesthesia Plan  ASA: II  Anesthesia Plan: MAC   Post-op Pain Management:    Induction:   Airway Management Planned: Nasal Cannula  Additional Equipment:   Intra-op Plan:   Post-operative Plan:   Informed Consent: I have reviewed the patients History and Physical, chart, labs and discussed the procedure including the risks, benefits and alternatives for the proposed anesthesia with the patient or authorized representative who has indicated his/her understanding and acceptance.     Plan Discussed with:   Anesthesia Plan Comments:         Anesthesia Quick Evaluation

## 2010-08-25 NOTE — Anesthesia Postprocedure Evaluation (Signed)
  Anesthesia Post-op Note  Patient: Casey Armstrong  Procedure(s) Performed:  CATARACT EXTRACTION PHACO AND INTRAOCULAR LENS PLACEMENT (IOC) - CDE 12.65  Patient Location: PACU  Anesthesia Type: MAC  Level of Consciousness: awake, alert  and oriented  Airway and Oxygen Therapy: Patient Spontanous Breathing  Post-op Pain: none  Post-op Assessment: Post-op Vital signs reviewed, Patient's Cardiovascular Status Stable and Respiratory Function Stable  Post-op Vital Signs: stable  Complications: No apparent anesthesia complications

## 2010-08-25 NOTE — Brief Op Note (Signed)
08/25/2010  9:58 AM  PATIENT:  Casey Armstrong  65 y.o. female  PRE-OPERATIVE DIAGNOSIS:  nuclear cataract right eye  POST-OPERATIVE DIAGNOSIS:  nuclear cataract right eye  PROCEDURE:  Procedure(s): CATARACT EXTRACTION PHACO AND INTRAOCULAR LENS PLACEMENT (IOC)  SURGEON:  Surgeon(s): Gemma Payor  ANESTHESIA:   local and IV sedation  SPECIMEN:  No Specimen

## 2010-09-05 ENCOUNTER — Encounter (HOSPITAL_COMMUNITY): Payer: Self-pay | Admitting: Ophthalmology

## 2010-11-15 ENCOUNTER — Encounter: Payer: Self-pay | Admitting: Internal Medicine

## 2010-12-20 ENCOUNTER — Telehealth: Payer: Self-pay | Admitting: Internal Medicine

## 2010-12-20 NOTE — Telephone Encounter (Signed)
Pt received a letter from [place] back in Oct 2012 that she was due to have her colonoscopy and may need an OV prior.  She doesn't feel like she needs to be seen in the office and would prefer to be triaged since she said she doesn't have a history of polyps. She did mention that her father died from colon cancer and I told her that for that reason she may need to come in for OV, but she would still rather be triaged via phone and she wants RMR ONLY for OV. I told her that his next available was on Jan 27, 2011. Please advise or call patient to triage.

## 2010-12-21 NOTE — Telephone Encounter (Signed)
I think that we can triag her initially -  if we don't detect any issue,   perhaps we can circumvent an office visit to satisfy the patient

## 2010-12-21 NOTE — Telephone Encounter (Signed)
Routed to DS 

## 2010-12-21 NOTE — Telephone Encounter (Signed)
LMOM to call.

## 2010-12-22 ENCOUNTER — Other Ambulatory Visit: Payer: Self-pay

## 2010-12-22 NOTE — Telephone Encounter (Signed)
Gastroenterology Pre-Procedure Form  Pt called to schedule colonoscopy  Request Date: 12/22/2010        Requesting Physician: Dr. Ouida Sills     PATIENT INFORMATION:  Casey Armstrong is a 65 y.o., female (DOB=May 14, 1945).  PROCEDURE: Procedure(s) requested: colonoscopy Procedure Reason: Hx of polyps  PATIENT REVIEW QUESTIONS: The patient reports the following:   1. Diabetes Melitis: no 2. Joint replacements in the past 12 months: no 3. Major health problems in the past 3 months: no 4. Has an artificial valve or MVP:no 5. Has been advised in past to take antibiotics in advance of a procedure like teeth cleaning: no}    MEDICATIONS & ALLERGIES:    Patient reports the following regarding taking any blood thinners:   Plavix? no Aspirin?no Coumadin?  no  Patient confirms/reports the following medications:  Current Outpatient Prescriptions  Medication Sig Dispense Refill  . acetaminophen (TYLENOL) 325 MG tablet Take 325 mg by mouth every 6 (six) hours as needed. For headaches        . Calcium Carb-Cholecalciferol (CALCIUM 1000 + D) 1000-800 MG-UNIT TABS Take 1 tablet by mouth daily.        . Doxylamine-Phenylephrine-APAP (VICKS NYQUIL SINUS) 6.25-5-325 MG CAPS Take 1 capsule by mouth 3 times/day as needed-between meals & bedtime. For sinuses      . ibuprofen (ADVIL,MOTRIN) 200 MG tablet Take 200 mg by mouth every 6 (six) hours as needed. For headaches       . LORazepam (ATIVAN) 1 MG tablet Take 0.5-1 mg by mouth at bedtime as needed. Sleep      . vitamin B-12 (CYANOCOBALAMIN) 500 MCG tablet Take 500 mcg by mouth daily.         Patient confirms/reports the following allergies:  No Known Allergies  Patient is appropriate to schedule for requested procedure(s): yes  AUTHORIZATION INFORMATION Primary Insurance:   ID #:   Group #:  Pre-Cert / Auth required: Pre-Cert / Auth #:   Secondary Insurance:   ID #:   Group #:  Pre-Cert / Auth required: Pre-Cert / Auth #:   No orders of the  defined types were placed in this encounter.    SCHEDULE INFORMATION: Procedure has been scheduled as follows:  Date: 01/30/2011    Time: 8:30 AM  Location: [facility] Short Stay  This Gastroenterology Pre-Precedure Form is being routed to the following provider(s) for review: R. Roetta Sessions, MD

## 2010-12-23 NOTE — Telephone Encounter (Signed)
Ok as is

## 2010-12-26 NOTE — Telephone Encounter (Signed)
Routed to DS 

## 2010-12-26 NOTE — Telephone Encounter (Signed)
Rx and instructions mailed.  

## 2011-01-18 ENCOUNTER — Encounter (HOSPITAL_COMMUNITY): Payer: Self-pay

## 2011-01-18 ENCOUNTER — Telehealth: Payer: Self-pay

## 2011-01-18 NOTE — Telephone Encounter (Signed)
Gastroenterology Pre-Procedure Form  Up date triage  Request Date: 01/18/2011        Requesting Physician: Dr. Ouida Sills     PATIENT INFORMATION:  Casey Armstrong is a 65 y.o., female (DOB=10-23-1945).  PROCEDURE: Procedure(s) requested: colonoscopy Procedure Reason: hx hyperplastic plastic polyps and FH of colon cancer  PATIENT REVIEW QUESTIONS: The patient reports the following:   1. Diabetes Melitis: no 2. Joint replacements in the past 12 months: no 3. Major health problems in the past 3 months: no 4. Has an artificial valve or MVP:no 5. Has been advised in past to take antibiotics in advance of a procedure like teeth cleaning: no}    MEDICATIONS & ALLERGIES:    Patient reports the following regarding taking any blood thinners:   Plavix? no Aspirin?no Coumadin?  no  Patient confirms/reports the following medications:  Current Outpatient Prescriptions  Medication Sig Dispense Refill  . acetaminophen (TYLENOL) 325 MG tablet Take 325 mg by mouth every 6 (six) hours as needed. For headaches        . Calcium Carb-Cholecalciferol (CALCIUM 1000 + D) 1000-800 MG-UNIT TABS Take 1 tablet by mouth daily.        . Doxylamine-Phenylephrine-APAP (VICKS NYQUIL SINUS) 6.25-5-325 MG CAPS Take 1 capsule by mouth 3 times/day as needed-between meals & bedtime. For sinuses      . ibuprofen (ADVIL,MOTRIN) 200 MG tablet Take 200 mg by mouth every 6 (six) hours as needed. For headaches       . LORazepam (ATIVAN) 1 MG tablet Take 0.5-1 mg by mouth at bedtime as needed. Sleep      . vitamin B-12 (CYANOCOBALAMIN) 500 MCG tablet Take 500 mcg by mouth daily.         Patient confirms/reports the following allergies:  No Known Allergies  Patient is appropriate to schedule for requested procedure(s): yes  AUTHORIZATION INFORMATION Primary Insurance:   ID #:   Group #:  Pre-Cert / Auth required:  Pre-Cert / Auth #:   Secondary Insurance:   ID #:   Group #:  Pre-Cert / Auth required: Pre-Cert / Auth #:     No orders of the defined types were placed in this encounter.    SCHEDULE INFORMATION: Procedure has been scheduled as follows:  Date: 01/30/2011             Time: 8:30 AM  Location: [facility] Short Stay  This Gastroenterology Pre-Precedure Form is being routed to the following provider(s) for review: R. Roetta Sessions, MD  Pt has Rx and instructions

## 2011-01-18 NOTE — Telephone Encounter (Signed)
OK as is.

## 2011-01-27 MED ORDER — FENTANYL CITRATE 0.05 MG/ML IJ SOLN: 25.0000 ug | INTRAMUSCULAR | Status: DC | PRN

## 2011-01-27 MED ORDER — ONDANSETRON HCL 4 MG/2ML IJ SOLN: 4.0000 mg | Freq: Once | INTRAMUSCULAR | Status: DC | PRN

## 2011-01-29 ENCOUNTER — Telehealth (INDEPENDENT_AMBULATORY_CARE_PROVIDER_SITE_OTHER): Payer: Self-pay | Admitting: Internal Medicine

## 2011-01-29 NOTE — Telephone Encounter (Signed)
Patient could not keep prep down.  She is undergoing screening colonoscopy in am by Dr. Jena Gauss. She was advised to take 10 ounces of Mg. Citrate and she cant keep it down either, she will take extra pills of Dulcolax.

## 2011-01-30 ENCOUNTER — Encounter (HOSPITAL_COMMUNITY)
Admission: RE | Disposition: A | Discharge status: Home / Self Care | Payer: Self-pay | Source: Ambulatory Visit | Attending: Internal Medicine

## 2011-01-30 ENCOUNTER — Other Ambulatory Visit: Payer: Self-pay | Admitting: Internal Medicine

## 2011-01-30 ENCOUNTER — Encounter (HOSPITAL_COMMUNITY): Payer: Self-pay | Admitting: *Deleted

## 2011-01-30 ENCOUNTER — Ambulatory Visit (HOSPITAL_COMMUNITY)
Admission: RE | Admit: 2011-01-30 | Discharge: 2011-01-30 | Disposition: A | Discharge status: Home / Self Care | Payer: Medicare Other | Source: Ambulatory Visit | Attending: Internal Medicine | Admitting: Internal Medicine

## 2011-01-30 DIAGNOSIS — Z1211 Encounter for screening for malignant neoplasm of colon: Secondary | ICD-10-CM | POA: Insufficient documentation

## 2011-01-30 DIAGNOSIS — Z800 Family history of malignant neoplasm of digestive organs: Secondary | ICD-10-CM

## 2011-01-30 DIAGNOSIS — D129 Benign neoplasm of anus and anal canal: Secondary | ICD-10-CM

## 2011-01-30 DIAGNOSIS — D128 Benign neoplasm of rectum: Secondary | ICD-10-CM | POA: Insufficient documentation

## 2011-01-30 DIAGNOSIS — K621 Rectal polyp: Secondary | ICD-10-CM

## 2011-01-30 DIAGNOSIS — K620 Anal polyp: Secondary | ICD-10-CM

## 2011-01-30 HISTORY — PX: COLONOSCOPY: SHX5424

## 2011-01-30 SURGERY — COLONOSCOPY
Anesthesia: Moderate Sedation

## 2011-01-30 MED ORDER — MIDAZOLAM HCL 5 MG/5ML IJ SOLN
INTRAMUSCULAR | Status: AC
  Filled 2011-01-30: qty 10

## 2011-01-30 MED ORDER — MEPERIDINE HCL 100 MG/ML IJ SOLN
INTRAMUSCULAR | Status: DC | PRN
  Administered 2011-01-30: 25 mg via INTRAVENOUS
  Administered 2011-01-30: 50 mg via INTRAVENOUS

## 2011-01-30 MED ORDER — MEPERIDINE HCL 100 MG/ML IJ SOLN
INTRAMUSCULAR | Status: AC
  Filled 2011-01-30: qty 2

## 2011-01-30 MED ORDER — MIDAZOLAM HCL 5 MG/5ML IJ SOLN
INTRAMUSCULAR | Status: DC | PRN
  Administered 2011-01-30: 1 mg via INTRAVENOUS
  Administered 2011-01-30: 2 mg via INTRAVENOUS
  Administered 2011-01-30: 1 mg via INTRAVENOUS

## 2011-01-30 MED ORDER — STERILE WATER FOR IRRIGATION IR SOLN
Status: DC | PRN
  Administered 2011-01-30: 09:00:00

## 2011-01-30 NOTE — Op Note (Signed)
[facility] [address] [place], [place]  [address]  COLONOSCOPY PROCEDURE REPORT  PATIENT:  Casey Armstrong, Casey Armstrong  MR#:  [id number] BIRTHDATE:  1945/07/09, 65 yrs. old  GENDER:  female ENDOSCOPIST:  R. Roetta Sessions, MD FACP [facility] REF. BY:          Dr. Carylon Perches PROCEDURE DATE:  01/30/2011 PROCEDURE:  colonoscopy with snare polypectomy and polyp ablation  INDICATIONS:  positive family history of colon cancer in a first-degree relative/high-risk screening  INFORMED CONSENT:  The risks, benefits, alternatives and imponderables including but not limited to bleeding, perforation as well as the possibility of a missed lesion have been reviewed. The potential for biopsy, lesion removal, etc. have also been discussed.  Questions have been answered.  All parties agreeable. Please see the history and physical in the medical record for more information.  MEDICATIONS:  Versed 4 mg IV and Demerol 75 mg IV in divided doses  DESCRIPTION OF PROCEDURE:  After a digital rectal exam was performed, the EC-[id number] ([id number]) colonoscope was advanced from the anus through the rectum and colon to the area of the cecum, ileocecal valve and appendiceal orifice.  The cecum was deeply intubated.  These structures were well-seen and photographed for the record.  From the level of the cecum and ileocecal valve, the scope was slowly and cautiously withdrawn.  The mucosal surfaces were carefully surveyed utilizing scope tip deflection to facilitate fold flattening as needed.  The scope was pulled down into the rectum where a thorough examination including retroflexion was performed. <<PROCEDUREIMAGES>>  FINDINGS:  Adequate preparation. A single 8 mm hyperplastic-appearing polyp at the rectosigmoid junction. Multiple diminutive distal rectal           polyps, anal papilla; otherwise, normal appearing rectal and colonic mucosa.  THERAPEUTIC / DIAGNOSTIC MANEUVERS PERFORMED:     the larger polyp noted  above was removed completely with hot snare cautery. The other diminutive polyps were ablated with the tip of a hot snare cautery                 loop.  COMPLICATIONS:  none  CECAL WITHDRAWAL TIME:14 minutes  IMPRESSION:    Colonic polyps treated as described above  RECOMMENDATIONS:    Followup on pathology.  ______________________________ R. Roetta Sessions, MD Caleen Essex  CC:  n. eSIGNED:   R. Roetta Sessions at 01/30/2011 09:34 AM  Twanna Hy, [phone]

## 2011-01-30 NOTE — H&P (Signed)
Primary Care Physician:  Carylon Perches, MD Primary Gastroenterologist:  Dr. Jena Gauss  Pre-Procedure History & Physical: HPI:  Casey Armstrong is a 66 y.o. female is here for a screening colonoscopy. Father had colon cancer. Last colonoscopy 5 years ago (hyperplastic polyp).  No GI symptoms currently.  Past Medical History  Diagnosis Date  . PONV (postoperative nausea and vomiting)     Past Surgical History  Procedure Date  . Tonsillectomy   . Dilation and curettage of uterus     30+ yrs ago  . Tubal ligation   . Eye surgery     left KPE w/ IOL  . Cataract extraction w/phaco 08/15/2010    Procedure: CATARACT EXTRACTION PHACO AND INTRAOCULAR LENS PLACEMENT (IOC);  Surgeon: Gemma Payor;  Location: [facility];  Service: Ophthalmology;  Laterality: Left;  CDE 14.67  . Cataract extraction w/phaco 08/25/2010    Procedure: CATARACT EXTRACTION PHACO AND INTRAOCULAR LENS PLACEMENT (IOC);  Surgeon: Gemma Payor;  Location: [facility];  Service: Ophthalmology;  Laterality: Right;  CDE 12.65    Prior to Admission medications   Medication Sig Start Date End Date Taking? Authorizing Provider  acetaminophen (TYLENOL) 325 MG tablet Take 325 mg by mouth every 6 (six) hours as needed. For headaches    Yes Historical Provider, MD  Calcium Carb-Cholecalciferol (CALCIUM 1000 + D) 1000-800 MG-UNIT TABS Take 1 tablet by mouth daily.    Yes Historical Provider, MD  Doxylamine-Phenylephrine-APAP (VICKS NYQUIL SINUS) 6.25-5-325 MG CAPS Take 1 capsule by mouth 3 times/day as needed-between meals & bedtime. For sinuses   Yes Historical Provider, MD  ibuprofen (ADVIL,MOTRIN) 200 MG tablet Take 200 mg by mouth every 6 (six) hours as needed. For headaches   Yes Historical Provider, MD  LORazepam (ATIVAN) 1 MG tablet Take 0.5-1 mg by mouth at bedtime as needed. Sleep   Yes Historical Provider, MD  vitamin B-12 (CYANOCOBALAMIN) 500 MCG tablet Take 500 mcg by mouth daily.    Yes Historical Provider, MD    Allergies as of 12/22/2010  .  (No Known Allergies)    Family History  Problem Relation Age of Onset  . Anesthesia problems Neg Hx   . Hypotension Neg Hx   . Malignant hyperthermia Neg Hx   . Pseudochol deficiency Neg Hx   . Alzheimer's disease Mother   . Cancer Father     History   Social History  . Marital Status: Divorced    Spouse Name: N/A    Number of Children: N/A  . Years of Education: N/A   Occupational History  . Not on file.   Social History Main Topics  . Smoking status: Former Smoker -- 0.5 packs/day for 30 years    Types: Cigarettes    Quit date: 08/11/2003  . Smokeless tobacco: Not on file  . Alcohol Use: 0.6 oz/week    1 Glasses of wine per week  . Drug Use: No  . Sexually Active: No   Other Topics Concern  . Not on file   Social History Narrative  . No narrative on file    Review of Systems: See HPI, otherwise negative ROS  Physical Exam: BP 121/72  Pulse 74  Temp(Src) 98.7 F (37.1 C) (Oral)  Ht 5\' 4"  (1.626 m)  Wt 130 lb (58.968 kg)  BMI 22.31 kg/m2 General:   Alert,  Well-developed, well-nourished, pleasant and cooperative in NAD Head:  Normocephalic and atraumatic. Eyes:  Sclera clear, no icterus.   Conjunctiva pink. Ears:  Normal auditory acuity. Nose:  No deformity, discharge,  or lesions. Mouth:  No deformity or lesions, dentition normal. Neck:  Supple; no masses or thyromegaly. Lungs:  Clear throughout to auscultation.   No wheezes, crackles, or rhonchi. No acute distress. Heart:  Regular rate and rhythm; no murmurs, clicks, rubs,  or gallops. Abdomen:  Flat positive bowel sounds, soft and nontender without appreciable mass or organomegaly Msk:  Symmetrical without gross deformities. Normal posture. Pulses:  Normal pulses noted. Extremities:  Without clubbing or edema. Neurologic:  Alert and  oriented x4;  grossly normal neurologically. Skin:  Intact without significant lesions or rashes. Cervical Nodes:  No significant cervical adenopathy. Psych:  Alert  and cooperative. Normal mood and affect.  Impression/Plan: [name] is now here to undergo a high risk screening colonoscopy.    Risks, benefits, limitations, imponderables and alternatives regarding colonoscopy have been reviewed with the patient. Questions have been answered. All parties agreeable.

## 2011-02-01 ENCOUNTER — Encounter: Payer: Self-pay | Admitting: Internal Medicine

## 2011-02-02 ENCOUNTER — Encounter (HOSPITAL_COMMUNITY): Payer: Self-pay | Admitting: Internal Medicine

## 2011-02-27 ENCOUNTER — Other Ambulatory Visit (HOSPITAL_COMMUNITY): Payer: Self-pay | Admitting: Internal Medicine

## 2011-02-27 DIAGNOSIS — Z139 Encounter for screening, unspecified: Secondary | ICD-10-CM

## 2011-03-20 ENCOUNTER — Ambulatory Visit (HOSPITAL_COMMUNITY)
Admission: RE | Admit: 2011-03-20 | Discharge: 2011-03-20 | Disposition: A | Discharge status: Home / Self Care | Payer: Medicare Other | Source: Ambulatory Visit | Attending: Internal Medicine | Admitting: Internal Medicine

## 2011-03-20 DIAGNOSIS — Z1231 Encounter for screening mammogram for malignant neoplasm of breast: Secondary | ICD-10-CM | POA: Insufficient documentation

## 2011-03-20 DIAGNOSIS — Z139 Encounter for screening, unspecified: Secondary | ICD-10-CM

## 2012-02-13 ENCOUNTER — Other Ambulatory Visit (HOSPITAL_COMMUNITY): Payer: Self-pay | Admitting: Internal Medicine

## 2012-02-13 DIAGNOSIS — Z139 Encounter for screening, unspecified: Secondary | ICD-10-CM

## 2012-03-21 ENCOUNTER — Ambulatory Visit (HOSPITAL_COMMUNITY)
Admission: RE | Admit: 2012-03-21 | Discharge: 2012-03-21 | Disposition: A | Discharge status: Home / Self Care | Payer: Medicare Other | Source: Ambulatory Visit | Attending: Internal Medicine | Admitting: Internal Medicine

## 2012-03-21 DIAGNOSIS — Z1231 Encounter for screening mammogram for malignant neoplasm of breast: Secondary | ICD-10-CM | POA: Insufficient documentation

## 2012-03-21 DIAGNOSIS — Z139 Encounter for screening, unspecified: Secondary | ICD-10-CM

## 2012-07-25 NOTE — Progress Notes (Signed)
NAMEKAMRIE, FANTON                 ACCOUNT NO.:  [id number]  MEDICAL RECORD NO.:  [id number]  LOCATION:                                 FACILITY:  PHYSICIAN:  Deah Ottaway L. Juanetta Gosling, M.D.DATE OF BIRTH:  Aug 25, 1945  DATE OF PROCEDURE: DATE OF DISCHARGE:                                PROGRESS NOTE   This is a patient of Dr. Renard Matter.  Ms. Keagle says she feels better.  She is still coughing and congested, but not as badly.  She has no other new complaints.  PHYSICAL EXAMINATION:  VITAL SIGNS:  Her blood pressure 148/95, pulse 77, respirations 20, O2 sats 97% on 4 liters. CHEST:  Her chest is much clearer than before, but she still has significant changes, which I believe are permanent. HEART:  Regular. ABDOMEN:  Soft.  ASSESSMENT:  She has exacerbation of bronchiectasis, has significant chronic changes on chest x-ray, but is doing okay now.  I do not plan to change any of her treatments.     Lendell Gallick L. Juanetta Gosling, M.D.     ELH/MEDQ  D:  07/24/2012  T:  07/24/2012  Job:  [id number]

## 2013-03-03 ENCOUNTER — Other Ambulatory Visit (HOSPITAL_COMMUNITY): Payer: Self-pay | Admitting: Internal Medicine

## 2013-03-03 DIAGNOSIS — Z139 Encounter for screening, unspecified: Secondary | ICD-10-CM

## 2013-03-24 ENCOUNTER — Ambulatory Visit (HOSPITAL_COMMUNITY)
Admission: RE | Admit: 2013-03-24 | Discharge: 2013-03-24 | Disposition: A | Discharge status: Home / Self Care | Payer: Medicare Other | Source: Ambulatory Visit | Attending: Internal Medicine | Admitting: Internal Medicine

## 2013-03-24 DIAGNOSIS — Z1231 Encounter for screening mammogram for malignant neoplasm of breast: Secondary | ICD-10-CM | POA: Insufficient documentation

## 2013-03-24 DIAGNOSIS — Z139 Encounter for screening, unspecified: Secondary | ICD-10-CM

## 2014-02-24 ENCOUNTER — Other Ambulatory Visit (HOSPITAL_COMMUNITY): Payer: Self-pay | Admitting: Internal Medicine

## 2014-02-24 DIAGNOSIS — Z1231 Encounter for screening mammogram for malignant neoplasm of breast: Secondary | ICD-10-CM

## 2014-03-27 ENCOUNTER — Ambulatory Visit (HOSPITAL_COMMUNITY)
Admission: RE | Admit: 2014-03-27 | Discharge: 2014-03-27 | Disposition: A | Discharge status: Home / Self Care | Payer: Medicare Other | Source: Ambulatory Visit | Attending: Internal Medicine | Admitting: Internal Medicine

## 2014-03-27 DIAGNOSIS — Z1231 Encounter for screening mammogram for malignant neoplasm of breast: Secondary | ICD-10-CM

## 2014-04-06 ENCOUNTER — Other Ambulatory Visit (HOSPITAL_COMMUNITY): Payer: Self-pay | Admitting: Internal Medicine

## 2014-04-06 DIAGNOSIS — Z780 Asymptomatic menopausal state: Secondary | ICD-10-CM

## 2014-04-13 ENCOUNTER — Ambulatory Visit (HOSPITAL_COMMUNITY)
Admission: RE | Admit: 2014-04-13 | Discharge: 2014-04-13 | Disposition: A | Discharge status: Home / Self Care | Payer: Medicare Other | Source: Ambulatory Visit | Attending: Internal Medicine | Admitting: Internal Medicine

## 2014-04-13 DIAGNOSIS — Z780 Asymptomatic menopausal state: Secondary | ICD-10-CM | POA: Insufficient documentation

## 2014-10-20 ENCOUNTER — Other Ambulatory Visit (HOSPITAL_COMMUNITY): Payer: Self-pay | Admitting: Internal Medicine

## 2014-10-20 ENCOUNTER — Ambulatory Visit (HOSPITAL_COMMUNITY)
Admission: RE | Admit: 2014-10-20 | Discharge: 2014-10-20 | Disposition: A | Discharge status: Home / Self Care | Payer: Medicare Other | Source: Ambulatory Visit | Attending: Internal Medicine | Admitting: Internal Medicine

## 2014-10-20 DIAGNOSIS — R05 Cough: Secondary | ICD-10-CM | POA: Insufficient documentation

## 2014-10-20 DIAGNOSIS — R059 Cough, unspecified: Secondary | ICD-10-CM

## 2014-12-23 ENCOUNTER — Ambulatory Visit (HOSPITAL_COMMUNITY)
Admission: RE | Admit: 2014-12-23 | Discharge: 2014-12-23 | Disposition: A | Discharge status: Home / Self Care | Payer: Medicare Other | Source: Ambulatory Visit | Attending: Internal Medicine | Admitting: Internal Medicine

## 2014-12-23 ENCOUNTER — Other Ambulatory Visit (HOSPITAL_COMMUNITY): Payer: Self-pay | Admitting: Internal Medicine

## 2014-12-23 DIAGNOSIS — M79672 Pain in left foot: Secondary | ICD-10-CM | POA: Diagnosis present

## 2014-12-23 DIAGNOSIS — R52 Pain, unspecified: Secondary | ICD-10-CM

## 2015-02-24 ENCOUNTER — Other Ambulatory Visit (HOSPITAL_COMMUNITY): Payer: Self-pay | Admitting: Internal Medicine

## 2015-02-24 DIAGNOSIS — Z1231 Encounter for screening mammogram for malignant neoplasm of breast: Secondary | ICD-10-CM

## 2015-03-29 ENCOUNTER — Ambulatory Visit (HOSPITAL_COMMUNITY)
Admission: RE | Admit: 2015-03-29 | Discharge: 2015-03-29 | Disposition: A | Discharge status: Home / Self Care | Payer: Medicare Other | Source: Ambulatory Visit | Attending: Internal Medicine | Admitting: Internal Medicine

## 2015-03-29 DIAGNOSIS — Z1231 Encounter for screening mammogram for malignant neoplasm of breast: Secondary | ICD-10-CM | POA: Diagnosis present

## 2015-11-22 ENCOUNTER — Other Ambulatory Visit: Payer: Self-pay | Admitting: Dermatology

## 2015-12-09 ENCOUNTER — Encounter: Payer: Self-pay | Admitting: Adult Health

## 2015-12-09 ENCOUNTER — Ambulatory Visit (INDEPENDENT_AMBULATORY_CARE_PROVIDER_SITE_OTHER): Payer: Self-pay | Admitting: Adult Health

## 2015-12-09 VITALS — BP 120/60 | HR 98 | Ht 63.0 in | Wt 128.0 lb

## 2015-12-09 DIAGNOSIS — L292 Pruritus vulvae: Secondary | ICD-10-CM

## 2015-12-09 DIAGNOSIS — N904 Leukoplakia of vulva: Secondary | ICD-10-CM

## 2015-12-09 MED ORDER — CLOBETASOL PROPIONATE 0.05 % EX CREA: TOPICAL_CREAM | CUTANEOUS | 1 refills | Status: DC

## 2015-12-09 NOTE — Patient Instructions (Signed)
Lichen Sclerosus Introduction Lichen sclerosus is a skin problem. It can happen on any part of the body, but it commonly involves the anal or genital areas. It can cause itching and discomfort in these areas. Treatment can help to control symptoms. When the genital area is affected, getting treatment is important because the condition can cause scarring that may lead to other problems. What are the causes? The cause of this condition is not known. It could be the result of an overactive immune system or a lack of certain hormones. Lichen sclerosus is not an infection or a fungus. It is not passed from one person to another (not contagious). What increases the risk? This condition is more likely to develop in women, usually after menopause. What are the signs or symptoms? Symptoms of this condition include:  Thin, wrinkled, white areas on the skin.  Thickened white areas on the skin.  Red and swollen patches (lesions) on the skin.  Tears or cracks in the skin.  Bruising.  Blood blisters.  Severe itching. You may also have pain, itching, or burning with urination. Constipation is also common in people with lichen sclerosus. How is this diagnosed? This condition may be diagnosed with a physical exam. In some cases, a tissue sample (biopsy sample) may be removed to be looked at under a microscope. How is this treated? This condition is usually treated with medicated creams or ointments (topical steroids) that are applied over the affected areas. Follow these instructions at home:  Take over-the-counter and prescription medicines only as told by your health care provider.  Use creams or ointments as told by your health care provider.  Do not scratch the affected areas of skin.  Women should keep the vaginal area as clean and dry as possible.  Keep all follow-up visits as told by your health care provider. This is important. Contact a health care provider if:  You have increasing  redness, swelling, or pain in the affected area.  You have fluid, blood, or pus coming from the affected area.  You have new lesions on your skin.  You have a fever.  You have pain during sex. This information is not intended to replace advice given to you by your health care provider. Make sure you discuss any questions you have with your health care provider. Document Released: 06/01/2010 Document Revised: 06/17/2015 Document Reviewed: 04/06/2014  2017 Elsevier Use temovate bid to affected area for 2 weeks then 2-3 x weekly Follow up in 8 weeks

## 2015-12-09 NOTE — Progress Notes (Signed)
Subjective:     Patient ID: Casey Armstrong, female   DOB: 1945-02-29, 70 y.o.   MRN: [id number]  HPI Casey Armstrong is a 70 year old white female in complaining of itching on right vulva area on and off for over a year, no discharge but has occasional sharp pain in vagina.She is not currently sexually active. PCP is Dr Willey Blade.   Review of Systems +vulva itching on and off for over a year, no discharge, has occasional sharp pain in vagina  Reviewed past medical,surgical, social and family history. Reviewed medications and allergies.     Objective:   Physical Exam BP 120/60 (BP Location: Left Arm, Patient Position: Sitting, Cuff Size: Normal)   Pulse 98   Ht 5\' 3"  (1.6 m)   Wt 128 lb (58.1 kg)   BMI 22.67 kg/m    PHQ 2 score 0. Skin warm and dry.Pelvic: external genitalia is normal in appearance, except right labia has irritated puffy area, where skin is red and thin, vagina: atrophic,urethra has no lesions or masses noted, cervix:atrophic, uterus: normal size, shape and contour, non tender, no masses felt, adnexa: no masses or tenderness noted. Bladder is non tender and no masses felt.  Discussed LSA and showed pictures, will try temovate cream,she is aware it is chronic and that if does not get better may need biopsy, as can be precancerous.  Assessment:     1. 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 clobetasol cream (TEMOVATE) 0.05 %    Sig: Use bid x 2 weeks then 2-3 x weekly thereafter    Dispense:  30 g    Refill:  1    Order Specific Question:   Supervising Provider    Answer:   Tania Ade H [[id number]]     Follow up in 8 weeks Review handout on lichen sclerosus

## 2016-01-04 ENCOUNTER — Encounter: Payer: Self-pay | Admitting: Internal Medicine

## 2016-02-03 ENCOUNTER — Ambulatory Visit (INDEPENDENT_AMBULATORY_CARE_PROVIDER_SITE_OTHER): Payer: Medicare Other | Admitting: Adult Health

## 2016-02-03 ENCOUNTER — Encounter: Payer: Self-pay | Admitting: Adult Health

## 2016-02-03 VITALS — BP 112/60 | HR 90 | Ht 63.0 in | Wt 130.0 lb

## 2016-02-03 DIAGNOSIS — N904 Leukoplakia of vulva: Secondary | ICD-10-CM

## 2016-02-03 NOTE — Patient Instructions (Signed)
Continue temovate 2-3 x weekly  Follow up in 4 months No sex for now

## 2016-02-03 NOTE — Progress Notes (Signed)
Subjective:     Patient ID: Casey Armstrong, female   DOB: [birth year], 71 y.o.   MRN: [id number]  HPI Casey Armstrong is a 71 year old white female back in follow up of using temovate to right labia.Still has some itching, but better and still has occasional sharp pain in vagina.  Review of Systems +vaginal itch at times occasional sharp pian in vagina Reviewed past medical,surgical, social and family history. Reviewed medications and allergies.     Objective:   Physical Exam BP 112/60 (BP Location: Left Arm, Patient Position: Sitting, Cuff Size: Normal)   Pulse 90   Ht 5\' 3"  (1.6 m)   Wt 130 lb (59 kg)   BMI 23.03 kg/m   PHQ 2 score 0.   Skin warm and dry.Pelvic: external genitalia is normal in appearance no lesions,the right labial area is not irritated or red and less puffy, vagina: atrophic,urethra has small area of redness at opening, cervix:smooth and atrophic, uterus: normal size, shape and contour, non tender, no masses felt, adnexa: no masses or tenderness noted. Bladder is non tender and no masses felt.  She declines vaginal estrogen at this time.  Assessment:     1. Lichen sclerosus et atrophicus of the vulva       Plan:     Continue temovate 2-3 x weekly  Follow up in 4 months No sex for now

## 2016-02-28 ENCOUNTER — Other Ambulatory Visit (HOSPITAL_COMMUNITY): Payer: Self-pay | Admitting: Internal Medicine

## 2016-02-28 DIAGNOSIS — Z1231 Encounter for screening mammogram for malignant neoplasm of breast: Secondary | ICD-10-CM

## 2016-03-29 ENCOUNTER — Ambulatory Visit (HOSPITAL_COMMUNITY)
Admission: RE | Admit: 2016-03-29 | Discharge: 2016-03-29 | Disposition: A | Discharge status: Home / Self Care | Payer: Medicare Other | Source: Ambulatory Visit | Attending: Internal Medicine | Admitting: Internal Medicine

## 2016-03-29 DIAGNOSIS — Z1231 Encounter for screening mammogram for malignant neoplasm of breast: Secondary | ICD-10-CM | POA: Insufficient documentation

## 2016-05-09 ENCOUNTER — Other Ambulatory Visit (HOSPITAL_COMMUNITY): Payer: Self-pay | Admitting: Internal Medicine

## 2016-05-09 DIAGNOSIS — Z780 Asymptomatic menopausal state: Secondary | ICD-10-CM

## 2016-05-25 ENCOUNTER — Ambulatory Visit (HOSPITAL_COMMUNITY)
Admission: RE | Admit: 2016-05-25 | Discharge: 2016-05-25 | Disposition: A | Discharge status: Home / Self Care | Payer: Medicare Other | Source: Ambulatory Visit | Attending: Internal Medicine | Admitting: Internal Medicine

## 2016-05-25 DIAGNOSIS — M8588 Other specified disorders of bone density and structure, other site: Secondary | ICD-10-CM | POA: Insufficient documentation

## 2016-05-25 DIAGNOSIS — M85851 Other specified disorders of bone density and structure, right thigh: Secondary | ICD-10-CM | POA: Insufficient documentation

## 2016-05-25 DIAGNOSIS — Z780 Asymptomatic menopausal state: Secondary | ICD-10-CM | POA: Insufficient documentation

## 2016-06-05 ENCOUNTER — Ambulatory Visit (INDEPENDENT_AMBULATORY_CARE_PROVIDER_SITE_OTHER): Payer: Medicare Other | Admitting: Adult Health

## 2016-06-05 ENCOUNTER — Encounter: Payer: Self-pay | Admitting: Adult Health

## 2016-06-05 VITALS — BP 110/62 | HR 88 | Ht 63.0 in | Wt 127.5 lb

## 2016-06-05 DIAGNOSIS — N904 Leukoplakia of vulva: Secondary | ICD-10-CM | POA: Diagnosis not present

## 2016-06-05 DIAGNOSIS — L292 Pruritus vulvae: Secondary | ICD-10-CM | POA: Diagnosis not present

## 2016-06-05 MED ORDER — FLUCONAZOLE 100 MG PO TABS: ORAL_TABLET | ORAL | 0 refills | Status: DC

## 2016-06-05 NOTE — Progress Notes (Signed)
Subjective:     Patient ID: Casey Armstrong, female   DOB: 1945/07/04, 71 y.o.   MRN: [id number]  HPI Casey Armstrong is a 71 year old white female back in F/U of using temovate for LSA and itching.She says still itching esp after bath at night.   Review of Systems Vulva still itching Reviewed past medical,surgical, social and family history. Reviewed medications and allergies.     Objective:   Physical Exam BP 110/62 (BP Location: Left Arm, Patient Position: Sitting, Cuff Size: Normal)   Pulse 88   Ht 5\' 3"  (1.6 m)   Wt 127 lb 8 oz (57.8 kg)   BMI 22.59 kg/m    Skin warm and dry, External genitalia is normal in appearance,right labia has no lesion, redness or swelling, nor does left labia.Dr Elonda Husky for Co exam, will paint with gentian violet and give diflucan just in case there is any skin yeast, and recheck in 3 days.  Assessment:     1. Lichen sclerosus et atrophicus of the vulva   2. Vulvar itching       Plan:     Rx diflucan 100 mg #10 take 1 daily for 10 days F/U in 3 weeks

## 2016-06-27 ENCOUNTER — Encounter: Payer: Self-pay | Admitting: Adult Health

## 2016-06-27 ENCOUNTER — Ambulatory Visit (INDEPENDENT_AMBULATORY_CARE_PROVIDER_SITE_OTHER): Payer: Medicare Other | Admitting: Adult Health

## 2016-06-27 VITALS — BP 104/50 | HR 90 | Ht 63.0 in | Wt 128.0 lb

## 2016-06-27 DIAGNOSIS — N904 Leukoplakia of vulva: Secondary | ICD-10-CM

## 2016-06-27 DIAGNOSIS — L292 Pruritus vulvae: Secondary | ICD-10-CM

## 2016-06-27 NOTE — Progress Notes (Signed)
Subjective:     Patient ID: Casey Armstrong, female   DOB: August 24, 1945, 71 y.o.   MRN: [id number]  HPI Casey Armstrong is a 71 year old white female back in follow up of taking 10 days of diflucan and having been painted with gentian violet and itching has stopped.   Review of Systems Vulva itching has stopped Reviewed past medical,surgical, social and family history. Reviewed medications and allergies.     Objective:   Physical Exam BP (!) 104/50 (BP Location: Left Arm, Patient Position: Sitting, Cuff Size: Normal)   Pulse 90   Ht 5\' 3"  (1.6 m)   Wt 128 lb (58.1 kg)   BMI 22.67 kg/m    Skin warm and dry.Pelvic: external genitalia is normal in appearance no lesions, swelling or redness.  Assessment:     LSA  Vulva itching has resolved     Plan:     Continue temovate 2 x weekly prn Follow up prn

## 2017-04-13 ENCOUNTER — Other Ambulatory Visit (HOSPITAL_COMMUNITY): Payer: Self-pay | Admitting: Internal Medicine

## 2017-04-13 DIAGNOSIS — Z1231 Encounter for screening mammogram for malignant neoplasm of breast: Secondary | ICD-10-CM

## 2017-04-20 ENCOUNTER — Other Ambulatory Visit (HOSPITAL_COMMUNITY): Payer: Self-pay | Admitting: Internal Medicine

## 2017-04-20 ENCOUNTER — Ambulatory Visit (HOSPITAL_COMMUNITY)
Admission: RE | Admit: 2017-04-20 | Discharge: 2017-04-20 | Disposition: A | Discharge status: Home / Self Care | Payer: Medicare Other | Source: Ambulatory Visit | Attending: Internal Medicine | Admitting: Internal Medicine

## 2017-04-20 DIAGNOSIS — Z1231 Encounter for screening mammogram for malignant neoplasm of breast: Secondary | ICD-10-CM | POA: Insufficient documentation

## 2017-04-27 ENCOUNTER — Other Ambulatory Visit (HOSPITAL_COMMUNITY): Payer: Self-pay | Admitting: Internal Medicine

## 2017-04-27 DIAGNOSIS — Z1231 Encounter for screening mammogram for malignant neoplasm of breast: Secondary | ICD-10-CM

## 2017-07-25 ENCOUNTER — Other Ambulatory Visit (HOSPITAL_COMMUNITY): Payer: Self-pay | Admitting: Internal Medicine

## 2017-07-25 ENCOUNTER — Ambulatory Visit (HOSPITAL_COMMUNITY)
Admission: RE | Admit: 2017-07-25 | Discharge: 2017-07-25 | Disposition: A | Discharge status: Home / Self Care | Payer: Medicare Other | Source: Ambulatory Visit | Attending: Internal Medicine | Admitting: Internal Medicine

## 2017-07-25 DIAGNOSIS — I700 Atherosclerosis of aorta: Secondary | ICD-10-CM | POA: Diagnosis not present

## 2017-07-25 DIAGNOSIS — M5137 Other intervertebral disc degeneration, lumbosacral region: Secondary | ICD-10-CM | POA: Insufficient documentation

## 2017-07-25 DIAGNOSIS — M48061 Spinal stenosis, lumbar region without neurogenic claudication: Secondary | ICD-10-CM

## 2017-07-27 ENCOUNTER — Other Ambulatory Visit (HOSPITAL_COMMUNITY): Payer: Self-pay | Admitting: Internal Medicine

## 2017-07-27 ENCOUNTER — Ambulatory Visit (HOSPITAL_COMMUNITY)
Admission: RE | Admit: 2017-07-27 | Discharge: 2017-07-27 | Disposition: A | Discharge status: Home / Self Care | Payer: Medicare Other | Source: Ambulatory Visit | Attending: Internal Medicine | Admitting: Internal Medicine

## 2017-07-27 ENCOUNTER — Encounter (HOSPITAL_COMMUNITY): Payer: Self-pay

## 2017-07-27 DIAGNOSIS — M48061 Spinal stenosis, lumbar region without neurogenic claudication: Secondary | ICD-10-CM

## 2017-08-21 ENCOUNTER — Ambulatory Visit (HOSPITAL_COMMUNITY): Payer: Medicare Other | Attending: Internal Medicine | Admitting: Physical Therapy

## 2017-08-21 ENCOUNTER — Other Ambulatory Visit: Payer: Self-pay

## 2017-08-21 ENCOUNTER — Encounter (HOSPITAL_COMMUNITY): Payer: Self-pay | Admitting: Physical Therapy

## 2017-08-21 DIAGNOSIS — R262 Difficulty in walking, not elsewhere classified: Secondary | ICD-10-CM | POA: Diagnosis present

## 2017-08-21 DIAGNOSIS — M6281 Muscle weakness (generalized): Secondary | ICD-10-CM | POA: Diagnosis present

## 2017-08-21 DIAGNOSIS — M5416 Radiculopathy, lumbar region: Secondary | ICD-10-CM

## 2017-08-21 NOTE — Patient Instructions (Addendum)
Stretching: Hamstring (Supine)    Supporting right thigh behind knee, slowly straighten knee until stretch is felt in back of thigh. Hold _30___ seconds. Repeat __3__ times per set. Do ____1 sets per session. Do __2__ sessions per day.  http://orth.exer.us/656   Copyright  VHI. All rights reserved.  Knee-to-Chest Stretch: Unilateral    With hand behind right knee, pull knee in to chest until a comfortable stretch is felt in lower back and buttocks. Keep back relaxed. Hold _30___ seconds. Repeat _3___ times per set. Do ___1_ sets per session. Do _2___ sessions per day.  http://orth.exer.us/126   Copyright  VHI. All rights reserved.  Piriformis Stretch (All-Fours)    With right leg crossed in front, slide other leg back, lowering hips until stretch is felt. Repeat ___2_ times per set. Do __1__ sets per session. Do 2____ sessions per day. REpeat to opposite side  http://orth.exer.us/292   Copyright  VHI. All rights reserved.  Isometric Abdominal    Lying on back with knees bent, tighten stomach . Hold __10__ seconds. Repeat _10___ times per set. Do ___1_ sets per session. Do __2__ sessions per day.  http://orth.exer.us/1086   Copyright  VHI. All rights reserved.

## 2017-08-22 NOTE — Therapy (Signed)
Casey Armstrong [address] [place], [place], [address] Phone: [phone]   Fax:  [phone]  Physical Therapy Evaluation  Patient Details  Name: Casey Armstrong MRN: [id number] Date of Birth: 06-09-1945 Referring Provider: Asencion Armstrong   Encounter Date: 08/21/2017  PT End of Session - 08/21/17 1646    Visit Number  1    Number of Visits  8    Date for PT Re-Evaluation  09/20/17    Authorization Type  [employer] medicare     PT Start Time  1525    PT Stop Time  1608    PT Time Calculation (min)  43 min    Activity Tolerance  Patient tolerated treatment well    Behavior During Therapy  Casey Armstrong for tasks assessed/performed       Past Medical History:  Diagnosis Date  . Lichen sclerosus   . PONV (postoperative nausea and vomiting)     Past Surgical History:  Procedure Laterality Date  . CATARACT EXTRACTION W/PHACO  08/15/2010   Procedure: CATARACT EXTRACTION PHACO AND INTRAOCULAR LENS PLACEMENT (IOC);  Surgeon: Casey Armstrong;  Location: [facility];  Service: Ophthalmology;  Laterality: Left;  CDE 14.67  . CATARACT EXTRACTION W/PHACO  08/25/2010   Procedure: CATARACT EXTRACTION PHACO AND INTRAOCULAR LENS PLACEMENT (IOC);  Surgeon: Casey Armstrong;  Location: [facility];  Service: Ophthalmology;  Laterality: Right;  CDE 12.65  . COLONOSCOPY  01/30/2011   Procedure: COLONOSCOPY;  Surgeon: Casey Dolin, MD;  Location: [facility];  Service: Endoscopy;  Laterality: N/A;  8:30 AM  . DILATION AND CURETTAGE OF UTERUS     30+ yrs ago  . EYE SURGERY     left KPE w/ IOL  . TONSILLECTOMY    . TUBAL LIGATION      There were no vitals filed for this visit.   Subjective Assessment - 08/21/17 1448    Subjective  Casey Armstrong states that she has been having sciatica B to the ankle level for two months.  She is in constant pain but it varies in intensity.  Standing still is the worst for her, followed by walking.  She has been on prescription medication but it has not helped the  pain.  She is now being referred to skilled PT.     Pertinent History  Pt has had back pain for the past ten years but normally the pain is intermittent and only lasts a few days.     Limitations  Lifting;Standing;Walking;House hold activities    How long can you sit comfortably?  Pt can sit for about 30 minutes     How long can you stand comfortably?  Pain will increase immediately which is new.      How long can you walk comfortably?  Able to walk for a few minutes before the pain increases.      Patient Stated Goals  less pain, to be able to do her yard work/house work quicker.  To be able to sleep longer, (waking up about 4-5 times a night at this time and then takes her an hour with the heating pad to get back to sleep     Currently in Pain?  Yes    Pain Score  7     Pain Location  Back    Pain Orientation  Left    Pain Descriptors / Indicators  Aching;Throbbing;Sharp;Shooting    Pain Type  Chronic pain    Pain Radiating Towards  Down her left  leg to the back of her knee     Pain Onset  More than a month ago    Pain Frequency  Constant    Aggravating Factors   activity     Pain Relieving Factors  nothing     Effect of Pain on Daily Activities  limits         Casey Armstrong PT Assessment - 08/21/17 0001      Assessment   Medical Diagnosis  spinal stenosis    Onset Date/Surgical Date  06/22/17    Next MD Visit  not scheduled     Prior Therapy  none      Precautions   Precautions  None      Restrictions   Weight Bearing Restrictions  No      Prior Function   Level of Independence  Independent    Vocation  Retired    Leisure  nothing       Charity fundraiser Status  Within Functional Limits for tasks assessed      Observation/Other Assessments   Focus on Therapeutic Outcomes (FOTO)   60      AROM   Lumbar Flexion  fingers to floor reps cause improvement of pain    Lumbar Extension  32 reps cause no change       Strength   Right Hip Flexion  5/5    Right Hip  Extension  4-/5    Right Hip ABduction  3+/5    Left Hip Flexion  5/5    Left Hip Extension  5/5    Left Hip ABduction  3+/5    Right Knee Flexion  5/5    Right Knee Extension  5/5    Left Knee Flexion  4+/5    Left Knee Extension  5/5    Right Ankle Dorsiflexion  3+/5    Left Ankle Dorsiflexion  5/5      Flexibility   Soft Tissue Assessment /Muscle Length  yes    Hamstrings  Lt 150; RT 155    Piriformis  Rt 50 Lt 25           Active hamstring stretch;  2 x 30"B Knee to chest  2x 30" B Quadriped piriformis 1x 60" B  Abdominal set 10x      Objective measurements completed on examination: See above findings.              PT Education - 08/21/17 1646    Education Details  HEP    Person(s) Educated  Patient    Methods  Explanation;Handout    Comprehension  Verbalized understanding;Returned demonstration       PT Short Term Goals - 08/21/17 0806      PT SHORT TERM GOAL #1   Title  Pt to be able to stand for 15 minutes to be able to complete self grooming tasks  in comfort     Time  2    Period  Weeks    Status  New    Target Date  09/04/17      PT SHORT TERM GOAL #2   Title  Pt to be able to walk for 20 mintues without pain to be able to complete short shopping trips in comfort     Time  2    Period  Weeks    Status  New      PT SHORT TERM GOAL #3   Title  Pt pain at night to be no greater  than a 4/10 to allow pt to be waking up no greater than 2x per night for better sleep.     Time  2    Period  Weeks        PT Long Term Goals - 08/21/17 0809      PT LONG TERM GOAL #1   Title  Pt to be able to stand for 30 minutes without having increased pain to be able to make a meal in comfort.    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     Time  4    Period  Weeks    Status  New      PT LONG TERM GOAL #3    Title  Pt pain to be no greater thana 2/10 to allow pt to be sleeping throughout the night.    Time  4    Period  Weeks    Status  New             Plan - 08/21/17 0806    Rehab Potential  Good    PT Frequency  2x / week    PT Duration  4 weeks    PT Treatment/Interventions  Patient/family education;Balance training;Therapeutic exercise;Therapeutic activities;Functional mobility training    PT Next Visit Plan  Progress lumbar stabilization and flexibility     PT Home Exercise Plan  pirformis, hamstring and knee to chest stretch; abdominal isometric     Consulted and Agree with Plan of Care  Patient       Patient will benefit from skilled therapeutic intervention in order to improve the following deficits and impairments:  Decreased activity tolerance, Decreased balance, Decreased strength, Difficulty walking, Impaired flexibility, Pain  Visit Diagnosis: Radiculopathy, lumbar region  Difficulty in walking, not elsewhere classified  Muscle weakness (generalized)     Problem List Patient Active Problem List   Diagnosis Date Noted  . Vulvar itching 06/05/2016  . Lichen sclerosus et atrophicus of the vulva 02/03/2016   Rayetta Humphrey, PT CLT [phone] 08/22/2017, 8:13 AM  [facility] [address] [place], [place], [address] Phone: [phone]   Fax:  [phone]  Name: Casey Armstrong MRN: [id number] Date of Birth: Jan 25, 1945

## 2017-08-23 ENCOUNTER — Ambulatory Visit (HOSPITAL_COMMUNITY): Payer: Medicare Other | Attending: Internal Medicine

## 2017-08-23 ENCOUNTER — Encounter (HOSPITAL_COMMUNITY): Payer: Self-pay

## 2017-08-23 DIAGNOSIS — R262 Difficulty in walking, not elsewhere classified: Secondary | ICD-10-CM | POA: Diagnosis present

## 2017-08-23 DIAGNOSIS — M6281 Muscle weakness (generalized): Secondary | ICD-10-CM | POA: Diagnosis present

## 2017-08-23 DIAGNOSIS — M5416 Radiculopathy, lumbar region: Secondary | ICD-10-CM | POA: Diagnosis present

## 2017-08-23 NOTE — Patient Instructions (Signed)
Piriformis Stretch, Supine    Lie supine, one ankle crossed onto opposite knee. Holding bottom leg behind knee, gently pull legs toward chest until stretch is felt in buttock of top leg. Hold 30 seconds. For deeper stretch gently push top knee away from body.  Repeat 3 times per session. Do 1-2 sessions per day.  Copyright  VHI. All rights reserved.   

## 2017-08-23 NOTE — Therapy (Signed)
[facility] [address] [place], [place], [address] Phone: [phone]   Fax:  [phone]  Physical Therapy Treatment  Patient Details  Name: ZAMYAH WIESMAN MRN: [id number] Date of Birth: 08-06-1945 Referring Provider: Asencion Noble   Encounter Date: 08/23/2017  PT End of Session - 08/23/17 1411    Visit Number  2    Number of Visits  8    Date for PT Re-Evaluation  09/20/17    Authorization Type  [employer] medicare     Authorization Time Period  7/30-->09/18/17     Authorization - Visit Number  2    Authorization - Number of Visits  10    PT Start Time  1350    PT Stop Time  1432    PT Time Calculation (min)  42 min    Activity Tolerance  Patient tolerated treatment well    Behavior During Therapy  [facility] for tasks assessed/performed       Past Medical History:  Diagnosis Date  . Lichen sclerosus   . PONV (postoperative nausea and vomiting)     Past Surgical History:  Procedure Laterality Date  . CATARACT EXTRACTION W/PHACO  08/15/2010   Procedure: CATARACT EXTRACTION PHACO AND INTRAOCULAR LENS PLACEMENT (IOC);  Surgeon: Tonny Branch;  Location: [facility];  Service: Ophthalmology;  Laterality: Left;  CDE 14.67  . CATARACT EXTRACTION W/PHACO  08/25/2010   Procedure: CATARACT EXTRACTION PHACO AND INTRAOCULAR LENS PLACEMENT (IOC);  Surgeon: Tonny Branch;  Location: [facility];  Service: Ophthalmology;  Laterality: Right;  CDE 12.65  . COLONOSCOPY  01/30/2011   Procedure: COLONOSCOPY;  Surgeon: Daneil Dolin, MD;  Location: [facility];  Service: Endoscopy;  Laterality: N/A;  8:30 AM  . DILATION AND CURETTAGE OF UTERUS     30+ yrs ago  . EYE SURGERY     left KPE w/ IOL  . TONSILLECTOMY    . TUBAL LIGATION      There were no vitals filed for this visit.  Subjective Assessment - 08/23/17 1354    Subjective  Pt stated she has a constant LBP, buttocks and Lt LE with radicular symptoms down to ankle, pain scale 6/10 achey pain.  Pt reports compliance wiht  HEP, does have some questions about form/technique with one of the stretches, stated current form hurts Rt knee.      Pertinent History  Pt has had back pain for the past ten years but normally the pain is intermittent and only lasts a few days.     Patient Stated Goals  less pain, to be able to do her yard work/house work quicker.  To be able to sleep longer, (waking up about 4-5 times a night at this time and then takes her an hour with the heating pad to get back to sleep     Currently in Pain?  Yes    Pain Score  6     Pain Location  Back    Pain Orientation  Lower    Pain Descriptors / Indicators  Aching;Dull    Pain Type  Chronic pain    Pain Radiating Towards  Down her Lt LE ending at ankle    Pain Onset  More than a month ago    Pain Frequency  Constant    Aggravating Factors   activity    Pain Relieving Factors  nothing    Effect of Pain on Daily Activities  limits  [facility] Adult PT Treatment/Exercise - 08/23/17 0001      Lumbar Exercises: Stretches   Active Hamstring Stretch  Right;Left;3 reps;30 seconds;Limitations    Active Hamstring Stretch Limitations  supine wiht use of rope last set    Single Knee to Chest Stretch  Right;Left;2 reps;30 seconds    Piriformis Stretch  Right;Left;3 reps;30 seconds reviewed form with quadruped as well as instructed figure 4     Piriformis Stretch Limitations  quadruped and supine figure 4      Lumbar Exercises: Supine   Ab Set  10 reps;5 seconds;Limitations    AB Set Limitations  cueing for proper activation    Clam  -- next session    Bent Knee Raise  10 reps;5 seconds with ab set             PT Education - 08/23/17 1424    Education Details  Reviewed goals, compliance with HEP and copy of eval given to pt.  Pt reports Rt knee pain with quadruped piriformis, reviewed form and instructed suping figure 4 for pain control    Person(s) Educated  Patient    Methods   Explanation;Demonstration;Handout    Comprehension  Verbalized understanding;Returned demonstration       PT Short Term Goals - 08/21/17 0806      PT SHORT TERM GOAL #1   Title  Pt to be able to stand for 15 minutes to be able to complete self grooming tasks  in comfort     Time  2    Period  Weeks    Status  New    Target Date  09/04/17      PT SHORT TERM GOAL #2   Title  Pt to be able to walk for 20 mintues without pain to be able to complete short shopping trips in comfort     Time  2    Period  Weeks    Status  New      PT SHORT TERM GOAL #3   Title  Pt pain at night to be no greater than a 4/10 to allow pt to be waking up no greater than 2x per night for better sleep.     Time  2    Period  Weeks        PT Long Term Goals - 08/21/17 0809      PT LONG TERM GOAL #1   Title  Pt to be able to stand for 30 minutes without having increased pain to be able to make a meal in comfort.    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     Time  4    Period  Weeks    Status  New      PT LONG TERM GOAL #3   Title  Pt pain to be no greater thana 2/10 to allow pt to be sleeping throughout the night.    Time  4    Period  Weeks    Status  New            Plan - 08/23/17 1429    Clinical Impression Statement  Reviewed goals and copy of eval given to pt.  Assured complaince iwht HEP and reviewed  form with min cueing for proper abdominal contraction and form with piriformis stretch.  Lumbar stabilization exercises instructed with min cueing for stability with task, pt able to demonstrate correct abdominal set prior LE movement with min cueing for breathing and ab set while lowering LE.  No reports of increased pain through session.      Rehab Potential  Good    PT Duration  4 weeks    PT Treatment/Interventions  Patient/family  education;Balance training;Therapeutic exercise;Therapeutic activities;Functional mobility training    PT Next Visit Plan  Progress lumbar stabilization and flexibility     PT Home Exercise Plan  pirformis, hamstring and knee to chest stretch; abdominal isometric        Patient will benefit from skilled therapeutic intervention in order to improve the following deficits and impairments:  Decreased activity tolerance, Decreased balance, Decreased strength, Difficulty walking, Impaired flexibility, Pain  Visit Diagnosis: Radiculopathy, lumbar region  Difficulty in walking, not elsewhere classified  Muscle weakness (generalized)     Problem List Patient Active Problem List   Diagnosis Date Noted  . Vulvar itching 06/05/2016  . Lichen sclerosus et atrophicus of the vulva 02/03/2016   Ihor Austin, LPTA; CBIS [phone]  Aldona Lento 08/23/2017, 2:56 PM  [facility] [address] [place], [place], [address] Phone: [phone]   Fax:  [phone]  Name: ALYSSON GEIST MRN: [id number] Date of Birth: [birth year]

## 2017-08-28 ENCOUNTER — Encounter (HOSPITAL_COMMUNITY): Payer: Self-pay

## 2017-08-28 ENCOUNTER — Ambulatory Visit (HOSPITAL_COMMUNITY): Payer: Medicare Other

## 2017-08-28 DIAGNOSIS — M6281 Muscle weakness (generalized): Secondary | ICD-10-CM

## 2017-08-28 DIAGNOSIS — R262 Difficulty in walking, not elsewhere classified: Secondary | ICD-10-CM

## 2017-08-28 DIAGNOSIS — M5416 Radiculopathy, lumbar region: Secondary | ICD-10-CM

## 2017-08-28 NOTE — Therapy (Signed)
[facility] [address] [place], [place], [address] Phone: [phone]   Fax:  [phone]  Physical Therapy Treatment  Patient Details  Name: DEMIAH Armstrong Armstrong: [id number] Date of Birth: 1946/01/10 Referring Provider: Asencion Noble   Encounter Date: 08/28/2017  PT End of Session - 08/28/17 1400    Visit Number  3    Number of Visits  8    Date for PT Re-Evaluation  09/20/17    Authorization Type  [employer] medicare     Authorization Time Period  7/30-->09/18/17     Authorization - Visit Number  3    Authorization - Number of Visits  10    PT Start Time  1346    PT Stop Time  1428    PT Time Calculation (min)  42 min    Activity Tolerance  Patient tolerated treatment well    Behavior During Therapy  [facility] for tasks assessed/performed       Past Medical History:  Diagnosis Date  . Lichen sclerosus   . PONV (postoperative nausea and vomiting)     Past Surgical History:  Procedure Laterality Date  . CATARACT EXTRACTION W/PHACO  08/15/2010   Procedure: CATARACT EXTRACTION PHACO AND INTRAOCULAR LENS PLACEMENT (IOC);  Surgeon: Tonny Branch;  Location: [facility];  Service: Ophthalmology;  Laterality: Left;  CDE 14.67  . CATARACT EXTRACTION W/PHACO  08/25/2010   Procedure: CATARACT EXTRACTION PHACO AND INTRAOCULAR LENS PLACEMENT (IOC);  Surgeon: Tonny Branch;  Location: [facility];  Service: Ophthalmology;  Laterality: Right;  CDE 12.65  . COLONOSCOPY  01/30/2011   Procedure: COLONOSCOPY;  Surgeon: Daneil Dolin, MD;  [facility];  Service: Endoscopy;  Laterality: N/A;  8:30 AM  . DILATION AND CURETTAGE OF UTERUS     30+ yrs ago  . EYE SURGERY     left KPE w/ IOL  . TONSILLECTOMY    . TUBAL LIGATION      There were no vitals filed for this visit.  Subjective Assessment - 08/28/17 1351    Subjective  Pt stated she woke up with increased pain this morning with radicular symptoms down to knee BLE for an hour and half, pain scale 5/10 achey  throbbing for LBP no radicular symptoms     Pertinent History  Pt has had back pain for the past ten years but normally the pain is intermittent and only lasts a few days.     Patient Stated Goals  less pain, to be able to do her yard work/house work quicker.  To be able to sleep longer, (waking up about 4-5 times a night at this time and then takes her an hour with the heating pad to get back to sleep     Currently in Pain?  Yes    Pain Score  5     Pain Location  Back    Pain Orientation  Lower    Pain Descriptors / Indicators  Aching;Throbbing    Pain Type  Chronic pain    Pain Radiating Towards  Down BLE ending at knee on 08/28/17 was ending at ankle Lt LE    Pain Onset  More than a month ago    Pain Frequency  Constant    Aggravating Factors   activity    Pain Relieving Factors  nothing    Effect of Pain on Daily Activities  limits  [facility] Adult PT Treatment/Exercise - 08/28/17 0001      Lumbar Exercises: Stretches   Active Hamstring Stretch  Right;Left;2 reps;30 seconds    Active Hamstring Stretch Limitations  supine with rope    Single Knee to Chest Stretch  Right;Left;2 reps;30 seconds    Figure 4 Stretch  2 reps;30 seconds;Supine;Limitations    Figure 4 Stretch Limitations  towel assistance      Lumbar Exercises: Supine   Ab Set  10 reps;5 seconds;Limitations    AB Set Limitations  cueing for proper activation    Clam  10 reps;5 seconds with ab set    Bent Knee Raise  10 reps;5 seconds    Bridge  10 reps;3 seconds               PT Short Term Goals - 08/21/17 0806      PT SHORT TERM GOAL #1   Title  Pt to be able to stand for 15 minutes to be able to complete self grooming tasks  in comfort     Time  2    Period  Weeks    Status  New    Target Date  09/04/17      PT SHORT TERM GOAL #2   Title  Pt to be able to walk for 20 mintues without pain to be able to complete short shopping trips in comfort     Time  2    Period   Weeks    Status  New      PT SHORT TERM GOAL #3   Title  Pt pain at night to be no greater than a 4/10 to allow pt to be waking up no greater than 2x per night for better sleep.     Time  2    Period  Weeks        PT Long Term Goals - 08/21/17 0809      PT LONG TERM GOAL #1   Title  Pt to be able to stand for 30 minutes without having increased pain to be able to make a meal in comfort.    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     Time  4    Period  Weeks    Status  New      PT LONG TERM GOAL #3   Title  Pt pain to be no greater thana 2/10 to allow pt to be sleeping throughout the night.    Time  4    Period  Weeks    Status  New            Plan - 08/28/17 1437    Clinical Impression Statement  Session focus with lumbar stability.  Reviewed abdominal contraction mechanics with min verbal/tactile cueing to improve activation for strengthening.  Added clam for stability with theraband resistance.  Pt educated on positive reports of reduced radicular symptoms to knee now vs. down to ankle.  No reoprts of increased pain through session.      Rehab Potential  Good    PT Frequency  2x / week    PT Duration  4 weeks    PT Treatment/Interventions  Patient/family education;Balance training;Therapeutic exercise;Therapeutic activities;Functional mobility training  PT Next Visit Plan  Progress lumbar stabilization and flexibility.  Add dead bug and clam sidelying.  Review form with ab set and stretches, progress to HEP only if able to demonstrate appropriate mechanics.  Progress to STS, squats and SLS when able.      PT Home Exercise Plan  pirformis, hamstring and knee to chest stretch; abdominal isometric; bridge       Patient will benefit from skilled therapeutic intervention in order to improve the following deficits and impairments:   Decreased activity tolerance, Decreased balance, Decreased strength, Difficulty walking, Impaired flexibility, Pain  Visit Diagnosis: Radiculopathy, lumbar region  Difficulty in walking, not elsewhere classified  Muscle weakness (generalized)     Problem List Patient Active Problem List   Diagnosis Date Noted  . Vulvar itching 06/05/2016  . Lichen sclerosus et atrophicus of the vulva 02/03/2016   Ihor Austin, LPTA; CBIS [phone]  Aldona Lento 08/28/2017, 2:42 PM  [facility] [place], [place], [address] Phone: [phone]   Fax:  [phone]  Name: Casey Armstrong Armstrong: [id number] Date of Birth: Sep 03, 1945

## 2017-08-30 ENCOUNTER — Encounter (HOSPITAL_COMMUNITY): Payer: Self-pay | Admitting: Physical Therapy

## 2017-08-30 ENCOUNTER — Ambulatory Visit (HOSPITAL_COMMUNITY): Payer: Medicare Other | Admitting: Physical Therapy

## 2017-08-30 DIAGNOSIS — M5416 Radiculopathy, lumbar region: Secondary | ICD-10-CM | POA: Diagnosis not present

## 2017-08-30 DIAGNOSIS — R262 Difficulty in walking, not elsewhere classified: Secondary | ICD-10-CM

## 2017-08-30 DIAGNOSIS — M6281 Muscle weakness (generalized): Secondary | ICD-10-CM

## 2017-08-30 NOTE — Therapy (Signed)
[facility] [address] [place], [place], [address] Phone: [phone]   Fax:  [phone]  Physical Therapy Treatment  Patient Details  Name: Casey Armstrong MRN: [id number] Date of Birth: March 17, 1945 Referring Provider: Asencion Noble   Encounter Date: 08/30/2017  PT End of Session - 08/30/17 1437    Visit Number  4    Number of Visits  8    Date for PT Re-Evaluation  09/20/17    Authorization Type  [employer] medicare     Authorization Time Period  7/30-->09/18/17     Authorization - Visit Number  4    Authorization - Number of Visits  10    PT Start Time  1435    PT Stop Time  1513    PT Time Calculation (min)  38 min    Activity Tolerance  Patient tolerated treatment well    Behavior During Therapy  [facility] for tasks assessed/performed       Past Medical History:  Diagnosis Date  . Lichen sclerosus   . PONV (postoperative nausea and vomiting)     Past Surgical History:  Procedure Laterality Date  . CATARACT EXTRACTION W/PHACO  08/15/2010   Procedure: CATARACT EXTRACTION PHACO AND INTRAOCULAR LENS PLACEMENT (IOC);  Surgeon: Tonny Branch;  Location: [facility];  Service: Ophthalmology;  Laterality: Left;  CDE 14.67  . CATARACT EXTRACTION W/PHACO  08/25/2010   Procedure: CATARACT EXTRACTION PHACO AND INTRAOCULAR LENS PLACEMENT (IOC);  Surgeon: Tonny Branch;  Location: [facility];  Service: Ophthalmology;  Laterality: Right;  CDE 12.65  . COLONOSCOPY  01/30/2011   Procedure: COLONOSCOPY;  Surgeon: Daneil Dolin, MD;  [facility]: [facility];  Service: Endoscopy;  Laterality: N/A;  8:30 AM  . DILATION AND CURETTAGE OF UTERUS     30+ yrs ago  . EYE SURGERY     left KPE w/ IOL  . TONSILLECTOMY    . TUBAL LIGATION      There were no vitals filed for this visit.  Subjective Assessment - 08/30/17 1435    Subjective  Patient reported that her back pain feels about the same today. She stated she has been doing her exercises.     Pertinent History  Pt has had  back pain for the past ten years but normally the pain is intermittent and only lasts a few days.     Patient Stated Goals  less pain, to be able to do her yard work/house work quicker.  To be able to sleep longer, (waking up about 4-5 times a night at this time and then takes her an hour with the heating pad to get back to sleep     Currently in Pain?  Yes    Pain Score  6     Pain Location  Back    Pain Orientation  Lower    Pain Descriptors / Indicators  Aching    Pain Type  Chronic pain    Pain Onset  More than a month ago                       [facility] Adult PT Treatment/Exercise - 08/30/17 0001      Lumbar Exercises: Stretches   Active Hamstring Stretch  Right;Left;2 reps;30 seconds    Active Hamstring Stretch Limitations  supine with rope    Single Knee to Chest Stretch  Right;Left;2 reps;30 seconds    Figure 4 Stretch  2 reps;30 seconds;Supine;Limitations;Other (comment)   Left  and right     Lumbar Exercises: Supine   Ab Set  10 reps;5 seconds;Limitations    AB Set Limitations  cueing for proper activation    Clam  10 reps;5 seconds    Bent Knee Raise  5 seconds;20 reps;Limitations    Bent Knee Raise Limitations  Alternating sides    Dead Bug  20 reps;Limitations    Dead Bug Limitations  alternating sides    Bridge  10 reps;3 seconds               PT Short Term Goals - 08/21/17 0806      PT SHORT TERM GOAL #1   Title  Pt to be able to stand for 15 minutes to be able to complete self grooming tasks  in comfort     Time  2    Period  Weeks    Status  New    Target Date  09/04/17      PT SHORT TERM GOAL #2   Title  Pt to be able to walk for 20 mintues without pain to be able to complete short shopping trips in comfort     Time  2    Period  Weeks    Status  New      PT SHORT TERM GOAL #3   Title  Pt pain at night to be no greater than a 4/10 to allow pt to be waking up no greater than 2x per night for better sleep.     Time  2    Period   Weeks        PT Long Term Goals - 08/21/17 0809      PT LONG TERM GOAL #1   Title  Pt to be able to stand for 30 minutes without having increased pain to be able to make a meal in comfort.    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     Time  4    Period  Weeks    Status  New      PT LONG TERM GOAL #3   Title  Pt pain to be no greater thana 2/10 to allow pt to be sleeping throughout the night.    Time  4    Period  Weeks    Status  New            Plan - 08/30/17 1459    Clinical Impression Statement  This session continued with established plan of care. Added dead bug exercise and progressed clams to sidelying. Patient required verbal cues for form throughout exercises. Plan to continue with progression of abdominal strengthening, lumbar stabilization, and stretches to improve strength and mobility.     Rehab Potential  Good    PT Frequency  2x / week    PT Duration  4 weeks    PT Treatment/Interventions  Patient/family education;Balance training;Therapeutic exercise;Therapeutic activities;Functional mobility training    PT Next Visit Plan  Progress lumbar stabilization and flexibility.  Review form with ab set and stretches, progress to HEP only if able to demonstrate appropriate mechanics.  Progress to STS, squats and SLS when able.      PT Home Exercise Plan  pirformis, hamstring and  knee to chest stretch; abdominal isometric; bridge       Patient will benefit from skilled therapeutic intervention in order to improve the following deficits and impairments:  Decreased activity tolerance, Decreased balance, Decreased strength, Difficulty walking, Impaired flexibility, Pain  Visit Diagnosis: Radiculopathy, lumbar region  Difficulty in walking, not elsewhere classified  Muscle weakness (generalized)     Problem List Patient  Active Problem List   Diagnosis Date Noted  . Vulvar itching 06/05/2016  . Lichen sclerosus et atrophicus of the vulva 02/03/2016   Clarene Critchley PT, DPT 3:19 PM, 08/30/17 [place], [place], [address] Phone: [phone]   Fax:  [phone]  Name: Casey Armstrong MRN: [id number] Date of Birth: Dec 19, 1945

## 2017-09-04 ENCOUNTER — Ambulatory Visit (HOSPITAL_COMMUNITY): Payer: Medicare Other | Admitting: Physical Therapy

## 2017-09-04 ENCOUNTER — Encounter (HOSPITAL_COMMUNITY): Payer: Self-pay | Admitting: Physical Therapy

## 2017-09-04 DIAGNOSIS — R262 Difficulty in walking, not elsewhere classified: Secondary | ICD-10-CM

## 2017-09-04 DIAGNOSIS — M5416 Radiculopathy, lumbar region: Secondary | ICD-10-CM

## 2017-09-04 DIAGNOSIS — M6281 Muscle weakness (generalized): Secondary | ICD-10-CM

## 2017-09-04 NOTE — Therapy (Signed)
[facility] [address] [place], [place], [address] Phone: [phone]   Fax:  [phone]  Physical Therapy Treatment  Patient Details  Name: Casey Armstrong MRN: [id number] Date of Birth: 12/03/1945 Referring Provider: Asencion Noble   Encounter Date: 09/04/2017  PT End of Session - 09/04/17 1652    Visit Number  5    Number of Visits  8    Date for PT Re-Evaluation  09/20/17    Authorization Type  [employer] medicare     Authorization Time Period  7/30-->09/18/17     Authorization - Visit Number  5    Authorization - Number of Visits  10    PT Start Time  6606    PT Stop Time  1726    PT Time Calculation (min)  38 min    Activity Tolerance  Patient tolerated treatment well    Behavior During Therapy  [facility] for tasks assessed/performed       Past Medical History:  Diagnosis Date  . Lichen sclerosus   . PONV (postoperative nausea and vomiting)     Past Surgical History:  Procedure Laterality Date  . CATARACT EXTRACTION W/PHACO  08/15/2010   Procedure: CATARACT EXTRACTION PHACO AND INTRAOCULAR LENS PLACEMENT (IOC);  Surgeon: Tonny Branch;  Location: [facility];  Service: Ophthalmology;  Laterality: Left;  CDE 14.67  . CATARACT EXTRACTION W/PHACO  08/25/2010   Procedure: CATARACT EXTRACTION PHACO AND INTRAOCULAR LENS PLACEMENT (IOC);  Surgeon: Tonny Branch;  Location: [facility];  Service: Ophthalmology;  Laterality: Right;  CDE 12.65  . COLONOSCOPY  01/30/2011   Procedure: COLONOSCOPY;  Surgeon: Daneil Dolin, MD;  Location: [facility];  Service: Endoscopy;  Laterality: N/A;  8:30 AM  . DILATION AND CURETTAGE OF UTERUS     30+ yrs ago  . EYE SURGERY     left KPE w/ IOL  . TONSILLECTOMY    . TUBAL LIGATION      There were no vitals filed for this visit.  Subjective Assessment - 09/04/17 1651    Subjective  Patient stated she has still been doing her exercises. She reported that her back is bothering her. She reported that she is having 6/10 pain  today.     Pertinent History  Pt has had back pain for the past ten years but normally the pain is intermittent and only lasts a few days.     Patient Stated Goals  less pain, to be able to do her yard work/house work quicker.  To be able to sleep longer, (waking up about 4-5 times a night at this time and then takes her an hour with the heating pad to get back to sleep     Currently in Pain?  Yes    Pain Score  6     Pain Location  Back    Pain Orientation  Lower    Pain Descriptors / Indicators  Aching    Pain Type  Chronic pain    Pain Onset  More than a month ago                       [facility] Adult PT Treatment/Exercise - 09/04/17 0001      Exercises   Exercises  Lumbar      Lumbar Exercises: Stretches   Single Knee to Chest Stretch  Right;Left;2 reps;30 seconds    Figure 4 Stretch  2 reps;30 seconds;Supine;Limitations;Other (comment)   left and right  Lumbar Exercises: Standing   Other Standing Lumbar Exercises  Palloff press x20 each lower extremity inside parallel bars with red theraband semi-tandem position      Lumbar Exercises: Supine   Bent Knee Raise  5 seconds;20 reps;Limitations    Bent Knee Raise Limitations  Alternating sides    Dead Bug  20 reps;Limitations    Dead Bug Limitations  alternating sides      Knee/Hip Exercises: Standing   Heel Raises Limitations  Toe raises x 20 both feet     Forward Lunges  Right;Left;2 sets;10 reps;Limitations    Forward Lunges Limitations  Onto 4-inch step with verbal cues for form and abdominal set    Hip Abduction  Stengthening;Right;Left;2 sets;10 reps;Knee straight    Abduction Limitations  VCs and demonstration for form    Functional Squat  2 sets;10 reps;Limitations    Functional Squat Limitations  Bilateral upper extremity assistance    Other Standing Knee Exercises  Forward lunge onto 4-inch step with abdominal set x 10 each lower extremity             PT Education - 09/04/17 1651    Education  Details  Discussed purpose and technique of exercises throughout session.     Person(s) Educated  Patient    Methods  Explanation    Comprehension  Verbalized understanding       PT Short Term Goals - 08/21/17 0806      PT SHORT TERM GOAL #1   Title  Pt to be able to stand for 15 minutes to be able to complete self grooming tasks  in comfort     Time  2    Period  Weeks    Status  New    Target Date  09/04/17      PT SHORT TERM GOAL #2   Title  Pt to be able to walk for 20 mintues without pain to be able to complete short shopping trips in comfort     Time  2    Period  Weeks    Status  New      PT SHORT TERM GOAL #3   Title  Pt pain at night to be no greater than a 4/10 to allow pt to be waking up no greater than 2x per night for better sleep.     Time  2    Period  Weeks        PT Long Term Goals - 08/21/17 0809      PT LONG TERM GOAL #1   Title  Pt to be able to stand for 30 minutes without having increased pain to be able to make a meal in comfort.    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     Time  4    Period  Weeks    Status  New      PT LONG TERM GOAL #3   Title  Pt pain to be no greater thana 2/10 to allow pt to be sleeping throughout the night.    Time  4    Period  Weeks    Status  New            Plan - 09/04/17  1733    Clinical Impression Statement  This session focused on continuing lumbar stabilization exercises and lower extremity functional strengthening. This session progressed to standing exercises including forward lunges, functional squats, and standing hip abduction and palloff press. Patient tolerated all exercises and denied any increase in back pain. Patient would benefit from continued skilled physical therapy with a focus on lumbar stabilization and functional lower extremity strengthening.       Rehab Potential  Good    PT Frequency  2x / week    PT Duration  4 weeks    PT Treatment/Interventions  Patient/family education;Balance training;Therapeutic exercise;Therapeutic activities;Functional mobility training    PT Next Visit Plan  Progress lumbar stabilization and flexibility.  Review form with ab set and stretches, progress to HEP only if able to demonstrate appropriate mechanics.  Progress to STS, squats and SLS when able.      PT Home Exercise Plan  pirformis, hamstring and knee to chest stretch; abdominal isometric; bridge       Patient will benefit from skilled therapeutic intervention in order to improve the following deficits and impairments:  Decreased activity tolerance, Decreased balance, Decreased strength, Difficulty walking, Impaired flexibility, Pain  Visit Diagnosis: Radiculopathy, lumbar region  Difficulty in walking, not elsewhere classified  Muscle weakness (generalized)     Problem List Patient Active Problem List   Diagnosis Date Noted  . Vulvar itching 06/05/2016  . Lichen sclerosus et atrophicus of the vulva 02/03/2016   Clarene Critchley PT, DPT 5:36 PM, 09/04/17 [place], [place], [address] Phone: [phone]   Fax:  [phone]  Name: SARALEE BOLICK MRN: [id number] Date of Birth: 23-Jan-1946

## 2017-09-07 ENCOUNTER — Encounter (HOSPITAL_COMMUNITY): Payer: Self-pay

## 2017-09-07 ENCOUNTER — Ambulatory Visit (HOSPITAL_COMMUNITY): Payer: Medicare Other

## 2017-09-07 DIAGNOSIS — M5416 Radiculopathy, lumbar region: Secondary | ICD-10-CM | POA: Diagnosis not present

## 2017-09-07 DIAGNOSIS — M6281 Muscle weakness (generalized): Secondary | ICD-10-CM

## 2017-09-07 DIAGNOSIS — R262 Difficulty in walking, not elsewhere classified: Secondary | ICD-10-CM

## 2017-09-07 NOTE — Therapy (Signed)
[facility] [address] [place], [place], [address] Phone: [phone]   Fax:  [phone]  Physical Therapy Treatment  Patient Details  Name: Casey Armstrong MRN: [id number] Date of Birth: April 15, 1945 Referring Provider: Asencion Noble   Encounter Date: 09/07/2017  PT End of Session - 09/07/17 1529    Visit Number  6    Number of Visits  8    Date for PT Re-Evaluation  09/20/17    Authorization Type  [employer] medicare     Authorization Time Period  7/30-->09/18/17     Authorization - Visit Number  6    Authorization - Number of Visits  10    PT Start Time  1519    PT Stop Time  1608    PT Time Calculation (min)  49 min    Activity Tolerance  Patient tolerated treatment well    Behavior During Therapy  [facility] for tasks assessed/performed       Past Medical History:  Diagnosis Date  . Lichen sclerosus   . PONV (postoperative nausea and vomiting)     Past Surgical History:  Procedure Laterality Date  . CATARACT EXTRACTION W/PHACO  08/15/2010   Procedure: CATARACT EXTRACTION PHACO AND INTRAOCULAR LENS PLACEMENT (IOC);  Surgeon: Tonny Branch;  Location: [facility];  Service: Ophthalmology;  Laterality: Left;  CDE 14.67  . CATARACT EXTRACTION W/PHACO  08/25/2010   Procedure: CATARACT EXTRACTION PHACO AND INTRAOCULAR LENS PLACEMENT (IOC);  Surgeon: Tonny Branch;  Location: [facility];  Service: Ophthalmology;  Laterality: Right;  CDE 12.65  . COLONOSCOPY  01/30/2011   Procedure: COLONOSCOPY;  Surgeon: Daneil Dolin, MD;  Location: [facility];  Service: Endoscopy;  Laterality: N/A;  8:30 AM  . DILATION AND CURETTAGE OF UTERUS     30+ yrs ago  . EYE SURGERY     left KPE w/ IOL  . TONSILLECTOMY    . TUBAL LIGATION      There were no vitals filed for this visit.  Subjective Assessment - 09/07/17 1520    Subjective  Pt stated she is feeling better today, reports pain scale 5/10 sore achey LBP, no reports of radicular symptoms today.  Does continue to c/o some  stomach ache though reports it better than last session.      Pertinent History  Pt has had back pain for the past ten years but normally the pain is intermittent and only lasts a few days.     Patient Stated Goals  less pain, to be able to do her yard work/house work quicker.  To be able to sleep longer, (waking up about 4-5 times a night at this time and then takes her an hour with the heating pad to get back to sleep     Currently in Pain?  Yes    Pain Score  5     Pain Location  Back    Pain Orientation  Lower    Pain Descriptors / Indicators  Aching;Sore    Pain Type  Chronic pain    Pain Onset  More than a month ago    Pain Frequency  Constant    Aggravating Factors   activity    Pain Relieving Factors  nothing    Effect of Pain on Daily Activities  limits           [facility] Adult PT Treatment/Exercise - 09/07/17 0001      Exercises   Exercises  Lumbar  Lumbar Exercises: Standing   Heel Raises  20 reps;3 seconds    Heel Raises Limitations  minimal hand use 3" holds heel and toe raises    Functional Squats  15 reps    Functional Squats Limitations  chair behind for improved mechanics    Forward Lunge  15 reps;3 seconds    Forward Lunge Limitations  4in step with ab set, no HHA    Other Standing Lumbar Exercises  tandem stance 1x 30"; Palov press    Other Standing Lumbar Exercises  SLS Lt 10", Rt 7" max of 3      Lumbar Exercises: Seated   Sit to Stand  10 reps;Limitations    Sit to Stand Limitations  no HHA, cueing for eccentric control      Lumbar Exercises: Supine   Ab Set  10 reps;5 seconds;Limitations    AB Set Limitations  good mechanics noted, DC exercise to HEP    Heel Slides  5 reps;Limitations    Heel Slides Limitations  with TrA activaiton    Bridge  15 reps;3 seconds               PT Short Term Goals - 08/21/17 0806      PT SHORT TERM GOAL #1   Title  Pt to be able to stand for 15 minutes to be able to complete self grooming tasks  in  comfort     Time  2    Period  Weeks    Status  New    Target Date  09/04/17      PT SHORT TERM GOAL #2   Title  Pt to be able to walk for 20 mintues without pain to be able to complete short shopping trips in comfort     Time  2    Period  Weeks    Status  New      PT SHORT TERM GOAL #3   Title  Pt pain at night to be no greater than a 4/10 to allow pt to be waking up no greater than 2x per night for better sleep.     Time  2    Period  Weeks        PT Long Term Goals - 08/21/17 0809      PT LONG TERM GOAL #1   Title  Pt to be able to stand for 30 minutes without having increased pain to be able to make a meal in comfort.    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     Time  4    Period  Weeks    Status  New      PT LONG TERM GOAL #3   Title  Pt pain to be no greater thana 2/10 to allow pt to be sleeping throughout the night.    Time  4    Period  Weeks    Status  New            Plan - 09/07/17 1531    Clinical Impression Statement  Pt able to acheive appropriate TrA activation wiht no cueing.  Continued session focus iwht lumbar stabilization exercises and LE functional strengthening.  Added heel slides with ab  set to improve eccentric movements with cueing for abdominal activation.  Progressed CKC functional strengtening exercises with additinal STS, reviewed mechanics for squats and balalnce activities including SLS, tandem stance with palov.  No reoprts of increased pain through session.      Rehab Potential  Good    PT Frequency  2x / week    PT Duration  4 weeks    PT Treatment/Interventions  Patient/family education;Balance training;Therapeutic exercise;Therapeutic activities;Functional mobility training    PT Next Visit Plan  Progress lumbar stabilization and flexibility.    Continue with CKC for LE strengthening, lumbar  stabiltiy and balance.    PT Home Exercise Plan  pirformis, hamstring and knee to chest stretch; abdominal isometric; bridge; 8/16: STS, SLS, heel/toe raises.       Patient will benefit from skilled therapeutic intervention in order to improve the following deficits and impairments:  Decreased activity tolerance, Decreased balance, Decreased strength, Difficulty walking, Impaired flexibility, Pain  Visit Diagnosis: Radiculopathy, lumbar region  Difficulty in walking, not elsewhere classified  Muscle weakness (generalized)     Problem List Patient Active Problem List   Diagnosis Date Noted  . Vulvar itching 06/05/2016  . Lichen sclerosus et atrophicus of the vulva 02/03/2016   Ihor Austin, LPTA; CBIS [phone]  Aldona Lento 09/07/2017, 5:47 PM  [facility] [address] [place], [place], [address] Phone: [phone]   Fax:  [phone]  Name: Casey Armstrong MRN: [id number] Date of Birth: April 06, 1945

## 2017-09-07 NOTE — Patient Instructions (Signed)
Toe / Heel Raise (Standing)    Standing with support, raise heels, then rock back on heels and raise toes. Repeat 20 times.  Copyright  VHI. All rights reserved.   Functional Quadriceps: Sit to Stand    Sit on edge of chair, feet flat on floor. Stand upright, extending knees fully. Repeat 10 times per set. Do 1-2 sets per day.  http://orth.exer.us/735   Copyright  VHI. All rights reserved.   Single Leg Balance: Eyes Open    Stand on right leg with eyes open. Hold 30 seconds. 5 reps 2 times per day.  http://ggbe.exer.us/5   Copyright  VHI. All rights reserved.

## 2017-09-14 ENCOUNTER — Encounter (HOSPITAL_COMMUNITY): Payer: Self-pay | Admitting: Physical Therapy

## 2017-09-14 ENCOUNTER — Ambulatory Visit (HOSPITAL_COMMUNITY): Payer: Medicare Other | Admitting: Physical Therapy

## 2017-09-14 DIAGNOSIS — M6281 Muscle weakness (generalized): Secondary | ICD-10-CM

## 2017-09-14 DIAGNOSIS — R262 Difficulty in walking, not elsewhere classified: Secondary | ICD-10-CM

## 2017-09-14 DIAGNOSIS — M5416 Radiculopathy, lumbar region: Secondary | ICD-10-CM

## 2017-09-14 NOTE — Therapy (Signed)
[facility] [address] [place], [place], [address] Phone: [phone]   Fax:  [phone]  Physical Therapy Treatment  Patient Details  Name: Casey Armstrong MRN: [id number] Date of Birth: January 26, 1945 Referring Provider: Asencion Noble   Encounter Date: 09/14/2017  PT End of Session - 09/14/17 1505    Visit Number  7    Number of Visits  8    Date for PT Re-Evaluation  09/20/17    Authorization Type  [employer] medicare     Authorization Time Period  7/30-->09/18/17     Authorization - Visit Number  7    Authorization - Number of Visits  10    PT Start Time  8250    PT Stop Time  1511    PT Time Calculation (min)  38 min    Activity Tolerance  Patient tolerated treatment well    Behavior During Therapy  [facility] for tasks assessed/performed       Past Medical History:  Diagnosis Date  . Lichen sclerosus   . PONV (postoperative nausea and vomiting)     Past Surgical History:  Procedure Laterality Date  . CATARACT EXTRACTION W/PHACO  08/15/2010   Procedure: CATARACT EXTRACTION PHACO AND INTRAOCULAR LENS PLACEMENT (IOC);  Surgeon: Tonny Branch;  Location: [facility];  Service: Ophthalmology;  Laterality: Left;  CDE 14.67  . CATARACT EXTRACTION W/PHACO  08/25/2010   Procedure: CATARACT EXTRACTION PHACO AND INTRAOCULAR LENS PLACEMENT (IOC);  Surgeon: Tonny Branch;  Location: [facility];  Service: Ophthalmology;  Laterality: Right;  CDE 12.65  . COLONOSCOPY  01/30/2011   Procedure: COLONOSCOPY;  Surgeon: Daneil Dolin, MD;  [facility];  Service: Endoscopy;  Laterality: N/A;  8:30 AM  . DILATION AND CURETTAGE OF UTERUS     30+ yrs ago  . EYE SURGERY     left KPE w/ IOL  . TONSILLECTOMY    . TUBAL LIGATION      There were no vitals filed for this visit.  Subjective Assessment - 09/14/17 1436    Subjective  Patient reported 5/10 soreness in her glutes. Patient reported that her stomach doesn't feel right.     Pertinent History  Pt has had back pain  for the past ten years but normally the pain is intermittent and only lasts a few days.     Patient Stated Goals  less pain, to be able to do her yard work/house work quicker.  To be able to sleep longer, (waking up about 4-5 times a night at this time and then takes her an hour with the heating pad to get back to sleep     Currently in Pain?  Yes    Pain Score  5     Pain Location  Buttocks    Pain Orientation  Lower    Pain Descriptors / Indicators  Sore    Pain Onset  More than a month ago                       [facility] Adult PT Treatment/Exercise - 09/14/17 0001      Lumbar Exercises: Standing   Heel Raises  20 reps;3 seconds    Heel Raises Limitations  minimal hand use 3" holds heel and toe raises    Functional Squats  15 reps    Functional Squats Limitations  chair behind for improved mechanics    Forward Lunge  15 reps;3 seconds    Forward Lunge  Limitations  each LE 4in step with ab set, no HHA    Other Standing Lumbar Exercises  Lumbar forward flexion/extension, sidebend left/right x 10 each. tandem stance 1x 30"; Palov press with red theraband    Other Standing Lumbar Exercises  SLS 10 x each side      Lumbar Exercises: Seated   Sit to Stand  10 reps;Limitations   Verbal cues for adjustment to decrease knee discomfort   Sit to Stand Limitations  Functional squat x15 tapping back onto chair for form      Lumbar Exercises: Supine   Heel Slides  Limitations;10 reps    Heel Slides Limitations  with TrA activaiton    Bent Knee Raise  5 seconds;20 reps;Limitations    Bent Knee Raise Limitations  Alternating sides    Bridge  15 reps;3 seconds             PT Education - 09/14/17 1505    Education Details  Discussed purpose and technique of exercises throughout session.     Person(s) Educated  Patient    Methods  Explanation    Comprehension  Verbalized understanding       PT Short Term Goals - 08/21/17 0806      PT SHORT TERM GOAL #1   Title  Pt to  be able to stand for 15 minutes to be able to complete self grooming tasks  in comfort     Time  2    Period  Weeks    Status  New    Target Date  09/04/17      PT SHORT TERM GOAL #2   Title  Pt to be able to walk for 20 mintues without pain to be able to complete short shopping trips in comfort     Time  2    Period  Weeks    Status  New      PT SHORT TERM GOAL #3   Title  Pt pain at night to be no greater than a 4/10 to allow pt to be waking up no greater than 2x per night for better sleep.     Time  2    Period  Weeks        PT Long Term Goals - 08/21/17 0809      PT LONG TERM GOAL #1   Title  Pt to be able to stand for 30 minutes without having increased pain to be able to make a meal in comfort.    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     Time  4    Period  Weeks    Status  New      PT LONG TERM GOAL #3   Title  Pt pain to be no greater thana 2/10 to allow pt to be sleeping throughout the night.    Time  4    Period  Weeks    Status  New            Plan - 09/14/17 1522    Clinical Impression Statement  This session continued with exercises to improve patient's lumbar stabilization and functional lower extremity strengthening and to improve spinal mobility. This session added forward and backward  trunk flexion to improve patient's back mobility. Patient did not report an increase in pain throughout session.     Rehab Potential  Good    PT Frequency  2x / week    PT Duration  4 weeks    PT Treatment/Interventions  Patient/family education;Balance training;Therapeutic exercise;Therapeutic activities;Functional mobility training    PT Next Visit Plan  Progress lumbar stabilization and flexibility.    Continue with CKC for LE strengthening, lumbar stabiltiy and balance.    PT Home Exercise Plan  pirformis, hamstring and knee  to chest stretch; abdominal isometric; bridge; 8/16: STS, SLS, heel/toe raises.       Patient will benefit from skilled therapeutic intervention in order to improve the following deficits and impairments:  Decreased activity tolerance, Decreased balance, Decreased strength, Difficulty walking, Impaired flexibility, Pain  Visit Diagnosis: Radiculopathy, lumbar region  Difficulty in walking, not elsewhere classified  Muscle weakness (generalized)     Problem List Patient Active Problem List   Diagnosis Date Noted  . Vulvar itching 06/05/2016  . Lichen sclerosus et atrophicus of the vulva 02/03/2016   Clarene Critchley PT, DPT 3:23 PM, 09/14/17 [place], [place], [address] Phone: [phone]   Fax:  [phone]  Name: MEDINA DEGRAFFENREID MRN: [id number] Date of Birth: 06-20-1945

## 2017-09-18 ENCOUNTER — Ambulatory Visit (HOSPITAL_COMMUNITY): Payer: Medicare Other | Admitting: Physical Therapy

## 2017-09-18 DIAGNOSIS — M5416 Radiculopathy, lumbar region: Secondary | ICD-10-CM | POA: Diagnosis not present

## 2017-09-18 DIAGNOSIS — R262 Difficulty in walking, not elsewhere classified: Secondary | ICD-10-CM

## 2017-09-18 DIAGNOSIS — M6281 Muscle weakness (generalized): Secondary | ICD-10-CM

## 2017-09-18 NOTE — Patient Instructions (Addendum)
Bridging    Slowly raise buttocks from floor, keeping stomach tight. Repeat __10__ times per set. Do __1__ sets per session. Do __2__ sessions per day.  http://orth.exer.us/1096   Copyright  VHI. All rights reserved.  Forward Lunge    Standing with feet shoulder width apart and stomach tight, step forward with left leg. Repeat __10__ times per set. Do _1___ sets per session. Do __2__ sessions per day.  http://orth.exer.us/1146   Copyright  VHI. All rights reserved.  Straight Leg Raise (Prone)    Abdomen and head supported, keep left knee locked and raise leg at hip. Avoid arching low back. Repeat __10__ times per set. Do __1__ sets per session. Do __2__ sessions per day.  http://orth.exer.us/1112   Copyright  VHI. All rights reserved.  Functional Quadriceps: Sit to Stand    Sit on edge of chair, feet flat on floor. Stand upright, extending knees fully. Repeat _10___ times per set. Do __1__ sets per session. Do _2___ sessions per day.  http://orth.exer.us/734   Copyright  VHI. All rights reserved.

## 2017-09-18 NOTE — Therapy (Signed)
[facility] [address] [place], [place], [address] Phone: [phone]   Fax:  [phone]  Physical Therapy Treatment  Patient Details  Name: Casey Armstrong MRN: [id number] Date of Birth: [birth year] Referring Provider: Asencion Noble    Encounter Date: 09/18/2017  PHYSICAL THERAPY DISCHARGE SUMMARY  Visits from Start of Care: 8  Current functional level related to goals / functional outcomes: See below   Remaining deficits: See below   Education / Equipment: HEP Plan: Patient agrees to discharge.  Patient goals were partially met. Patient is being discharged due to being pleased with the current functional level.  ?????     PT End of Session - 09/18/17 1605    Visit Number  8    Number of Visits  8    Date for PT Re-Evaluation  09/20/17    Authorization Type  [employer] medicare     Authorization Time Period  7/30-->09/18/17     Authorization - Visit Number  8    Authorization - Number of Visits  8    PT Start Time  1430    PT Stop Time  1514    PT Time Calculation (min)  44 min    Activity Tolerance  Patient tolerated treatment well    Behavior During Therapy  WFL for tasks assessed/performed       Past Medical History:  Diagnosis Date  . Lichen sclerosus   . PONV (postoperative nausea and vomiting)     Past Surgical History:  Procedure Laterality Date  . CATARACT EXTRACTION W/PHACO  08/15/2010   Procedure: CATARACT EXTRACTION PHACO AND INTRAOCULAR LENS PLACEMENT (IOC);  Surgeon: Tonny Branch;  Location: [facility];  Service: Ophthalmology;  Laterality: Left;  CDE 14.67  . CATARACT EXTRACTION W/PHACO  08/25/2010   Procedure: CATARACT EXTRACTION PHACO AND INTRAOCULAR LENS PLACEMENT (IOC);  Surgeon: Tonny Branch;  Location: [facility];  Service: Ophthalmology;  Laterality: Right;  CDE 12.65  . COLONOSCOPY  01/30/2011   Procedure: COLONOSCOPY;  Surgeon: Daneil Dolin, MD;  Location: [facility];  Service: Endoscopy;  Laterality: N/A;  8:30 AM   . DILATION AND CURETTAGE OF UTERUS     30+ yrs ago  . EYE SURGERY     left KPE w/ IOL  . TONSILLECTOMY    . TUBAL LIGATION      There were no vitals filed for this visit.  Subjective Assessment - 09/18/17 1439    Subjective  Pt pain continues to be at a 5.  Her pain down her leg has really improved.     Pertinent History  Pt has had back pain for the past ten years but normally the pain is intermittent and only lasts a few days.     How long can you sit comfortably?  can sit for an hour now was 30 mintutes     How long can you stand comfortably?  Able to stand for 30 minutes did have immediate pain    How long can you walk comfortably?  pt is not walking for exercise yet.      Patient Stated Goals  less pain, to be able to do her yard work/house work quicker.  To be able to sleep longer, (waking up about 4-5 times a night at this time and then takes her an hour with the heating pad to get back to sleep     Currently in Pain?  Yes    Pain Score  5  Pain Location  Back    Pain Onset  More than a month ago    Aggravating Factors   weightbearing     Pain Relieving Factors  exercises          [facility] PT Assessment - 09/18/17 0001      Assessment   Medical Diagnosis  spinal stenosis    Referring Provider  Asencion Noble     Onset Date/Surgical Date  06/22/17    Next MD Visit  not scheduled     Prior Therapy  none      Precautions   Precautions  None      Restrictions   Weight Bearing Restrictions  No      Balance Screen   Has the patient fallen in the past 6 months  No    Has the patient had a decrease in activity level because of a fear of falling?   Yes    Is the patient reluctant to leave their home because of a fear of falling?   No      Prior Function   Level of Independence  Independent    Vocation  Retired    Leisure  nothing       Charity fundraiser Status  Within Functional Limits for tasks assessed      Observation/Other Assessments   Focus on  Therapeutic Outcomes (FOTO)   58   was 60      AROM   Lumbar Flexion  fingers to floor   reps cause improvement of pain   Lumbar Extension  35   reps cause no change      Strength   Right Hip Flexion  5/5    Right Hip Extension  4/5   was 4-/5    Right Hip ABduction  4+/5   was 3+/5    Left Hip Flexion  5/5    Left Hip Extension  5/5    Left Hip ABduction  5/5   was 3+/5    Right Knee Flexion  5/5    Right Knee Extension  5/5    Left Knee Flexion  4+/5   was 4+/5    Left Knee Extension  5/5    Right Ankle Dorsiflexion  5/5   was 3+   Left Ankle Dorsiflexion  5/5      Flexibility   Soft Tissue Assessment /Muscle Length  yes    Hamstrings  Lt 165 was 150 ; RT155 was  155    Piriformis  Rt55 was  50 Lt 48 was 25                           PT Education - 09/18/17 1604    Education Details  PT encouraged to walk every day give benefits of walking sheet, pt given information on the senior citizen [facility] as well as the [employer].  Answered questions on exercises and updated HEP>     Person(s) Educated  Patient    Methods  Explanation;Verbal cues;Handout;Demonstration    Comprehension  Verbalized understanding;Returned demonstration       PT Short Term Goals - 09/18/17 1505      PT SHORT TERM GOAL #1   Title  Pt to be able to stand for 15 minutes to be able to complete self grooming tasks  in comfort     Time  2    Period  Weeks    Status  Achieved  PT SHORT TERM GOAL #2   Title  Pt to be able to walk for 20 mintues without pain to be able to complete short shopping trips in comfort     Time  2    Period  Weeks    Status  Achieved      PT SHORT TERM GOAL #3   Title  Pt pain at night to be no greater than a 4/10 to allow pt to be waking up no greater than 2x per night for better sleep.     Time  2    Period  Weeks    Status  On-going        PT Long Term Goals - 09/18/17 1506      PT LONG TERM GOAL #1   Title  Pt to be able to stand for 30  minutes without having increased pain to be able to make a meal in comfort.    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     Time  4    Period  Weeks    Status  On-going      PT LONG TERM GOAL #3   Title  Pt pain to be no greater thana 2/10 to allow pt to be sleeping throughout the night.    Time  4    Period  Weeks    Status  On-going            Plan - 09/18/17 1606    Clinical Impression Statement  PT reassessed today with improved mm strength, obliteration of radicular sx (pt main concern), and decreased lumbar pain.  PT requests discharge as she if happy with her functional outcome at this time.     Rehab Potential  Good    PT Frequency  2x / week    PT Duration  4 weeks    PT Treatment/Interventions  Patient/family education;Balance training;Therapeutic exercise;Therapeutic activities;Functional mobility training    PT Next Visit Plan  Discharge.     PT Home Exercise Plan  pirformis, hamstring and knee to chest stretch; abdominal isometric; bridge; 8/16: STS, SLS, heel/toe raises.09/18/2017:  sit tot stand, lunge, bridge and prone hip extension.      Consulted and Agree with Plan of Care  Patient       Patient will benefit from skilled therapeutic intervention in order to improve the following deficits and impairments:  Decreased activity tolerance, Decreased balance, Decreased strength, Difficulty walking, Impaired flexibility, Pain  Visit Diagnosis: Radiculopathy, lumbar region  Difficulty in walking, not elsewhere classified  Muscle weakness (generalized)     Problem List Patient Active Problem List   Diagnosis Date Noted  . Vulvar itching 06/05/2016  . Lichen sclerosus et atrophicus of the vulva 02/03/2016  Rayetta Humphrey, PT CLT [phone] 09/18/2017, 4:10 PM  [facility], [place], [address] Phone: [phone]   Fax:  [phone]  Name: Casey Armstrong MRN: [id number] Date of Birth: 08-Jun-1945

## 2018-04-19 ENCOUNTER — Other Ambulatory Visit (HOSPITAL_COMMUNITY): Payer: Self-pay | Admitting: Internal Medicine

## 2018-04-19 DIAGNOSIS — Z1231 Encounter for screening mammogram for malignant neoplasm of breast: Secondary | ICD-10-CM

## 2018-05-31 ENCOUNTER — Ambulatory Visit (HOSPITAL_COMMUNITY): Payer: Medicare Other

## 2018-06-12 ENCOUNTER — Other Ambulatory Visit (HOSPITAL_COMMUNITY): Payer: Self-pay | Admitting: Internal Medicine

## 2018-06-12 DIAGNOSIS — Z780 Asymptomatic menopausal state: Secondary | ICD-10-CM

## 2018-06-27 ENCOUNTER — Ambulatory Visit (HOSPITAL_COMMUNITY): Payer: Medicare Other

## 2018-06-27 ENCOUNTER — Encounter (HOSPITAL_COMMUNITY): Payer: Self-pay

## 2018-06-27 ENCOUNTER — Ambulatory Visit (HOSPITAL_COMMUNITY)
Admission: RE | Admit: 2018-06-27 | Discharge: 2018-06-27 | Disposition: A | Discharge status: Home / Self Care | Payer: Medicare Other | Source: Ambulatory Visit | Attending: Internal Medicine | Admitting: Internal Medicine

## 2018-06-27 ENCOUNTER — Other Ambulatory Visit: Payer: Self-pay

## 2018-06-27 DIAGNOSIS — Z780 Asymptomatic menopausal state: Secondary | ICD-10-CM | POA: Diagnosis present

## 2018-06-27 DIAGNOSIS — Z1231 Encounter for screening mammogram for malignant neoplasm of breast: Secondary | ICD-10-CM | POA: Diagnosis present

## 2019-05-27 ENCOUNTER — Other Ambulatory Visit (HOSPITAL_COMMUNITY): Payer: Self-pay | Admitting: Internal Medicine

## 2019-05-27 DIAGNOSIS — Z1231 Encounter for screening mammogram for malignant neoplasm of breast: Secondary | ICD-10-CM

## 2019-06-05 DIAGNOSIS — I700 Atherosclerosis of aorta: Secondary | ICD-10-CM | POA: Diagnosis not present

## 2019-06-05 DIAGNOSIS — M810 Age-related osteoporosis without current pathological fracture: Secondary | ICD-10-CM | POA: Diagnosis not present

## 2019-06-05 DIAGNOSIS — R7301 Impaired fasting glucose: Secondary | ICD-10-CM | POA: Diagnosis not present

## 2019-06-05 DIAGNOSIS — E785 Hyperlipidemia, unspecified: Secondary | ICD-10-CM | POA: Diagnosis not present

## 2019-06-05 DIAGNOSIS — F419 Anxiety disorder, unspecified: Secondary | ICD-10-CM | POA: Diagnosis not present

## 2019-06-05 DIAGNOSIS — Z79899 Other long term (current) drug therapy: Secondary | ICD-10-CM | POA: Diagnosis not present

## 2019-06-05 DIAGNOSIS — G4700 Insomnia, unspecified: Secondary | ICD-10-CM | POA: Diagnosis not present

## 2019-06-12 DIAGNOSIS — I700 Atherosclerosis of aorta: Secondary | ICD-10-CM | POA: Diagnosis not present

## 2019-06-12 DIAGNOSIS — R7301 Impaired fasting glucose: Secondary | ICD-10-CM | POA: Diagnosis not present

## 2019-06-12 DIAGNOSIS — Z0001 Encounter for general adult medical examination with abnormal findings: Secondary | ICD-10-CM | POA: Diagnosis not present

## 2019-06-12 DIAGNOSIS — Z6822 Body mass index (BMI) 22.0-22.9, adult: Secondary | ICD-10-CM | POA: Diagnosis not present

## 2019-06-12 DIAGNOSIS — E785 Hyperlipidemia, unspecified: Secondary | ICD-10-CM | POA: Diagnosis not present

## 2019-06-12 DIAGNOSIS — M810 Age-related osteoporosis without current pathological fracture: Secondary | ICD-10-CM | POA: Diagnosis not present

## 2019-06-30 ENCOUNTER — Ambulatory Visit (HOSPITAL_COMMUNITY)
Admission: RE | Admit: 2019-06-30 | Discharge: 2019-06-30 | Disposition: A | Discharge status: Home / Self Care | Payer: Medicare PPO | Source: Ambulatory Visit | Attending: Internal Medicine | Admitting: Internal Medicine

## 2019-06-30 ENCOUNTER — Other Ambulatory Visit: Payer: Self-pay

## 2019-06-30 DIAGNOSIS — Z1231 Encounter for screening mammogram for malignant neoplasm of breast: Secondary | ICD-10-CM | POA: Insufficient documentation

## 2019-07-24 DIAGNOSIS — Z79899 Other long term (current) drug therapy: Secondary | ICD-10-CM | POA: Diagnosis not present

## 2019-07-24 DIAGNOSIS — E785 Hyperlipidemia, unspecified: Secondary | ICD-10-CM | POA: Diagnosis not present

## 2019-07-31 DIAGNOSIS — I700 Atherosclerosis of aorta: Secondary | ICD-10-CM | POA: Diagnosis not present

## 2019-07-31 DIAGNOSIS — E785 Hyperlipidemia, unspecified: Secondary | ICD-10-CM | POA: Diagnosis not present

## 2019-12-25 ENCOUNTER — Ambulatory Visit: Payer: Medicare PPO

## 2019-12-29 DIAGNOSIS — L308 Other specified dermatitis: Secondary | ICD-10-CM | POA: Diagnosis not present

## 2020-01-01 ENCOUNTER — Ambulatory Visit: Payer: Medicare PPO

## 2020-01-08 ENCOUNTER — Ambulatory Visit: Payer: Medicare PPO

## 2020-02-05 ENCOUNTER — Ambulatory Visit: Payer: Medicare PPO | Attending: Internal Medicine

## 2020-02-05 DIAGNOSIS — Z23 Encounter for immunization: Secondary | ICD-10-CM

## 2020-02-05 NOTE — Progress Notes (Signed)
   Covid-19 Vaccination Clinic  Name:  Casey Armstrong    MRN: [id number] DOB: 1945-05-15  02/05/2020  Ms. [name] was observed post Covid-19 immunization for 15 minutes without incident. She was provided with Vaccine Information Sheet and instruction to access the V-Safe system.   Ms. [name] was instructed to call 911 with any severe reactions post vaccine: Marland Kitchen Difficulty breathing  . Swelling of face and throat  . A fast heartbeat  . A bad rash all over body  . Dizziness and weakness   Immunizations Administered    Name Date Dose VIS Date Route   Moderna Covid-19 Booster Vaccine 02/05/2020  3:55 PM 0.25 mL 11/12/2019 Intramuscular   Manufacturer: Moderna   Lot: [id number]   [place]: [phone]

## 2020-02-24 DIAGNOSIS — J019 Acute sinusitis, unspecified: Secondary | ICD-10-CM | POA: Diagnosis not present

## 2020-03-03 ENCOUNTER — Ambulatory Visit: Payer: Medicare PPO | Admitting: Adult Health

## 2020-03-03 ENCOUNTER — Other Ambulatory Visit: Payer: Self-pay

## 2020-03-03 ENCOUNTER — Encounter: Payer: Self-pay | Admitting: Adult Health

## 2020-03-03 VITALS — BP 146/79 | HR 109 | Ht 63.0 in | Wt 127.0 lb

## 2020-03-03 DIAGNOSIS — B369 Superficial mycosis, unspecified: Secondary | ICD-10-CM | POA: Diagnosis not present

## 2020-03-03 DIAGNOSIS — L292 Pruritus vulvae: Secondary | ICD-10-CM | POA: Diagnosis not present

## 2020-03-03 MED ORDER — FLUCONAZOLE 100 MG PO TABS: ORAL_TABLET | ORAL | 0 refills | Status: DC

## 2020-03-03 NOTE — Progress Notes (Signed)
  Subjective:     Patient ID: Casey Armstrong, female   DOB: 09-04-1945, 75 y.o.   MRN: [id number]  HPI Casey Armstrong is a 75 year old white female,divorced, PM in complaining of itching in vulva area, has been on antibiotic for sinus infection for 3 months, now on amoxil. PCP is Dr Willey Blade.   Review of Systems Has been on antibiotics for sinus infection for 3 months now, just started amoxil and has itching of vulva area and having 3-4 BMS a day Has had lichen sclerosus in past, but no temovate in 2 years has not been bothered.   Reviewed past medical,surgical, social and family history. Reviewed medications and allergies.     Objective:   Physical Exam BP (!) 146/79 (BP Location: Left Arm, Patient Position: Sitting, Cuff Size: Normal)   Pulse (!) 109   Ht 5\' 3"  (1.6 m)   Wt 127 lb (57.6 kg)   BMI 22.50 kg/m  Skin warm and dry.Pelvic: external genitalia is normal in appearance, has some redness inner creases of labia, and white discharge in creases .Painted vulva and perineum with gentian violet  Fall risk is low  Upstream - 03/03/20 1110      Pregnancy Intention Screening   Does the patient want to become pregnant in the next year? N/A    Does the patient's partner want to become pregnant in the next year? N/A    Would the patient like to discuss contraceptive options today? N/A      Contraception Wrap Up   Current Method Female Sterilization    End Method Female Sterilization    Contraception Counseling Provided No             Assessment:     1. Vulvar itching Painted with gentian violet   2. Superficial fungus infection of skin Will rx diflucan Meds ordered this encounter  Medications  . fluconazole (DIFLUCAN) 100 MG tablet    Sig: Take 1 daily for 10 days    Dispense:  10 tablet    Refill:  0    Order Specific Question:   Supervising Provider    Answer:   Elonda Husky, LUTHER H [[id number]]   3. Has history of lichen sclerosus but no problem in over 2 years     Plan:     Pat area  dry, no soap or rubbing Follow up with me in 2 weeks or sooner of needed

## 2020-03-18 ENCOUNTER — Encounter: Payer: Self-pay | Admitting: Adult Health

## 2020-03-18 ENCOUNTER — Other Ambulatory Visit: Payer: Self-pay

## 2020-03-18 ENCOUNTER — Ambulatory Visit: Payer: Medicare PPO | Admitting: Adult Health

## 2020-03-18 VITALS — BP 131/82 | HR 107 | Ht 63.0 in | Wt 128.0 lb

## 2020-03-18 DIAGNOSIS — L292 Pruritus vulvae: Secondary | ICD-10-CM | POA: Diagnosis not present

## 2020-03-18 NOTE — Progress Notes (Signed)
  Subjective:     Patient ID: Casey Armstrong, female   DOB: June 27, 1945, 75 y.o.   MRN: [id number]  HPI Casey Armstrong is a 75 year old white female, divorced, PM back in follow up on vulva itching and skin fungus and feels much better at a little itching near clitoris area last 2 days. PCP is Dr Willey Blade.   Review of Systems Vulva itching is much better Has had some itching near clitoral area last 2 days Reviewed past medical,surgical, social and family history. Reviewed medications and allergies.     Objective:   Physical Exam BP 131/82 (BP Location: Left Arm, Patient Position: Sitting, Cuff Size: Normal)   Pulse (!) 107   Ht 5\' 3"  (1.6 m)   Wt 128 lb (58.1 kg)   BMI 22.67 kg/m   Skin warm and dry.Pelvic: external genitalia is normal in appearance no lesions, painted inner labia and clitoral area with gentian violet Examination chaperoned by Levy Pupa LPN.   [facility] - 03/18/20 1427      Pregnancy Intention Screening   Does the patient want to become pregnant in the next year? N/A    Does the patient's partner want to become pregnant in the next year? N/A    Would the patient like to discuss contraceptive options today? N/A      Contraception Wrap Up   Current Method Female Sterilization    End Method Female Sterilization    Contraception Counseling Provided No             Assessment:     1. Vulvar itching, much better Painted inner labia and clitoral area with gentian violet     Plan:     Follow up prn

## 2020-05-20 ENCOUNTER — Other Ambulatory Visit (HOSPITAL_COMMUNITY): Payer: Self-pay | Admitting: Internal Medicine

## 2020-05-20 DIAGNOSIS — Z1231 Encounter for screening mammogram for malignant neoplasm of breast: Secondary | ICD-10-CM

## 2020-06-07 DIAGNOSIS — E785 Hyperlipidemia, unspecified: Secondary | ICD-10-CM | POA: Diagnosis not present

## 2020-06-07 DIAGNOSIS — R7301 Impaired fasting glucose: Secondary | ICD-10-CM | POA: Diagnosis not present

## 2020-06-07 DIAGNOSIS — F419 Anxiety disorder, unspecified: Secondary | ICD-10-CM | POA: Diagnosis not present

## 2020-06-07 DIAGNOSIS — M1990 Unspecified osteoarthritis, unspecified site: Secondary | ICD-10-CM | POA: Diagnosis not present

## 2020-06-07 DIAGNOSIS — I700 Atherosclerosis of aorta: Secondary | ICD-10-CM | POA: Diagnosis not present

## 2020-06-10 DIAGNOSIS — L308 Other specified dermatitis: Secondary | ICD-10-CM | POA: Diagnosis not present

## 2020-06-10 DIAGNOSIS — D485 Neoplasm of uncertain behavior of skin: Secondary | ICD-10-CM | POA: Diagnosis not present

## 2020-06-10 DIAGNOSIS — Z1283 Encounter for screening for malignant neoplasm of skin: Secondary | ICD-10-CM | POA: Diagnosis not present

## 2020-06-10 DIAGNOSIS — L578 Other skin changes due to chronic exposure to nonionizing radiation: Secondary | ICD-10-CM | POA: Diagnosis not present

## 2020-06-10 DIAGNOSIS — L820 Inflamed seborrheic keratosis: Secondary | ICD-10-CM | POA: Diagnosis not present

## 2020-06-10 DIAGNOSIS — D225 Melanocytic nevi of trunk: Secondary | ICD-10-CM | POA: Diagnosis not present

## 2020-06-14 ENCOUNTER — Other Ambulatory Visit (HOSPITAL_COMMUNITY): Payer: Self-pay | Admitting: Internal Medicine

## 2020-06-14 DIAGNOSIS — R7301 Impaired fasting glucose: Secondary | ICD-10-CM | POA: Diagnosis not present

## 2020-06-14 DIAGNOSIS — E785 Hyperlipidemia, unspecified: Secondary | ICD-10-CM | POA: Diagnosis not present

## 2020-06-14 DIAGNOSIS — M810 Age-related osteoporosis without current pathological fracture: Secondary | ICD-10-CM | POA: Diagnosis not present

## 2020-06-14 DIAGNOSIS — Z780 Asymptomatic menopausal state: Secondary | ICD-10-CM

## 2020-06-14 DIAGNOSIS — Z6820 Body mass index (BMI) 20.0-20.9, adult: Secondary | ICD-10-CM | POA: Diagnosis not present

## 2020-06-14 DIAGNOSIS — I700 Atherosclerosis of aorta: Secondary | ICD-10-CM | POA: Diagnosis not present

## 2020-06-24 DIAGNOSIS — L988 Other specified disorders of the skin and subcutaneous tissue: Secondary | ICD-10-CM | POA: Diagnosis not present

## 2020-06-24 DIAGNOSIS — D485 Neoplasm of uncertain behavior of skin: Secondary | ICD-10-CM | POA: Diagnosis not present

## 2020-06-30 ENCOUNTER — Other Ambulatory Visit: Payer: Self-pay

## 2020-06-30 ENCOUNTER — Ambulatory Visit (HOSPITAL_COMMUNITY)
Admission: RE | Admit: 2020-06-30 | Discharge: 2020-06-30 | Disposition: A | Discharge status: Home / Self Care | Payer: Medicare PPO | Source: Ambulatory Visit | Attending: Internal Medicine | Admitting: Internal Medicine

## 2020-06-30 DIAGNOSIS — M810 Age-related osteoporosis without current pathological fracture: Secondary | ICD-10-CM | POA: Diagnosis not present

## 2020-06-30 DIAGNOSIS — Z780 Asymptomatic menopausal state: Secondary | ICD-10-CM

## 2020-06-30 DIAGNOSIS — M8589 Other specified disorders of bone density and structure, multiple sites: Secondary | ICD-10-CM | POA: Diagnosis not present

## 2020-06-30 DIAGNOSIS — Z1231 Encounter for screening mammogram for malignant neoplasm of breast: Secondary | ICD-10-CM | POA: Diagnosis not present

## 2020-08-26 ENCOUNTER — Ambulatory Visit (INDEPENDENT_AMBULATORY_CARE_PROVIDER_SITE_OTHER): Payer: Medicare PPO

## 2020-08-26 ENCOUNTER — Ambulatory Visit: Payer: Medicare PPO | Admitting: Podiatry

## 2020-08-26 ENCOUNTER — Other Ambulatory Visit: Payer: Self-pay

## 2020-08-26 DIAGNOSIS — M778 Other enthesopathies, not elsewhere classified: Secondary | ICD-10-CM | POA: Diagnosis not present

## 2020-08-26 DIAGNOSIS — M19079 Primary osteoarthritis, unspecified ankle and foot: Secondary | ICD-10-CM

## 2020-08-26 DIAGNOSIS — M79671 Pain in right foot: Secondary | ICD-10-CM

## 2020-08-26 MED ORDER — MELOXICAM 7.5 MG PO TABS: 7.5000 mg | ORAL_TABLET | Freq: Every day | ORAL | 0 refills | Status: DC | PRN

## 2020-08-26 NOTE — Patient Instructions (Signed)
You can use either the mobic I have sent to the pharmacy or you can use "voltaren gel" that you can purchase at the store  If was nice to meet you today. If you have any questions or any further concerns, please feel fee to give me a call. You can call our office at [phone] or please feel fee to send me a message through [place].    WEARING INSTRUCTIONS FOR ORTHOTICS  Don't expect to be comfortable wearing your orthotic devices for the first time.  Like eyeglasses, you may be aware of them as time passes, they will not be uncomfortable and you will enjoy wearing them.  FOLLOW THESE INSTRUCTIONS EXACTLY!  Wear your orthotic devices for:       Not more than 1 hour the first day.       Not more than 2 hours the second day.       Not more than 3 hours the third day and so on.        Or wear them for as long as they feel comfortable.       If you experience discomfort in your feet or legs take them out.  When feet & legs feel       better, put them back in.  You do need to be consistent and wear them a little        everyday. 2.   If at any time the orthotic devices become acutely uncomfortable before the       time for that particular day, STOP WEARING THEM. 3.   On the next day, do not increase the wearing time. 4.   Subsequently, increase the wearing time by 15-30 minutes only if comfortable to do       so. 5.   You will be seen by your doctor about 2-4 weeks after you receive your orthotic       devices, at which time you will probably be wearing your devices comfortably        for about 8 hours or more a day. 6.   Some patients occasionally report mild aches or discomfort in other parts of the of       body such as the knees, hips or back after 3 or 4 consecutive hours of wear.  If this       is the case with you, do not extend your wearing time.  Instead, cut it back an hour or       two.  In all likelihood, these symptoms will disappear in a short period of time as your        body posture realigns itself and functions more efficiently. 7.   It is possible that your orthotic device may require some small changes or adjustment       to improve their function or make them more comfortable.   This is usually not done       before one to three months have elapsed.  These adjustments are made in        accordance with the changed position your feet are assuming as a result of       improved biomechanical function. 8.   In women's shoes, it's not unusual for your heel to slip out of the shoe, particularly if       they are step-in-shoes.  If this is the case, try other shoes or other styles.  Try to       purchase shoes  which have deeper heal seats or higher heel counters. 9.   Squeaking of orthotics devices in the shoes is due to the movement of the devices       when they are functioning normally.  To eliminate squeaking, simply dust some       baby powder into your shoes before inserting the devices.  If this does not work,        apply soap or wax to the edges of the orthotic devices or put a tissue into the shoes. 10. It is important that you follow these directions explicitly.  Failure to do so will simply       prolong the adjustment period or create problems which are easily avoided.  It makes       no difference if you are wearing your orthotic devices for only a few hours after        several months, so long as you are wearing them comfortably for those hours. 11. If you have any questions or complaints, contact our office.  We have no way of       knowing about your problems unless you tell us.  If we do not hear from you, we will       assume that you are proceeding well.

## 2020-08-26 NOTE — Progress Notes (Signed)
F

## 2020-09-01 NOTE — Progress Notes (Signed)
Subjective:   Patient ID: Casey Armstrong, female   DOB: 75 y.o.   MRN: [id number]   HPI 75 year old female presents the office With concerns of bilateral foot pain for the right side worse than left.  She describes discomfort in the top of her foot.  She says he gets a tingling, funny feeling at times.  She said no recent treatment.  No recent injury.  This is been ongoing since February.   Review of Systems  All other systems reviewed and are negative.  Past Medical History:  Diagnosis Date   Lichen sclerosus    PONV (postoperative nausea and vomiting)     Past Surgical History:  Procedure Laterality Date   CATARACT EXTRACTION W/PHACO  08/15/2010   Procedure: CATARACT EXTRACTION PHACO AND INTRAOCULAR LENS PLACEMENT ([facility]);  Surgeon: Tonny Branch;  Location: [facility];  Service: Ophthalmology;  Laterality: Left;  CDE 14.67   CATARACT EXTRACTION W/PHACO  08/25/2010   Procedure: CATARACT EXTRACTION PHACO AND INTRAOCULAR LENS PLACEMENT (IOC);  Surgeon: Tonny Branch;  Location: [facility];  Service: Ophthalmology;  Laterality: Right;  CDE 12.65   COLONOSCOPY  01/30/2011   Procedure: COLONOSCOPY;  Surgeon: Daneil Dolin, MD;  Location[facility];  Service: Endoscopy;  Laterality: N/A;  8:30 AM   DILATION AND CURETTAGE OF UTERUS     30+ yrs ago   EYE SURGERY     left KPE w/ IOL   TONSILLECTOMY     TUBAL LIGATION       Current Outpatient Medications:    meloxicam (MOBIC) 7.5 MG tablet, Take 1 tablet (7.5 mg total) by mouth daily as needed for pain., Disp: 14 tablet, Rfl: 0   acetaminophen (TYLENOL) 325 MG tablet, Take 325 mg by mouth every 6 (six) hours as needed. For headaches , Disp: , Rfl:    Cholecalciferol (VITAMIN D3 PO), Take by mouth daily., Disp: , Rfl:    MAGNESIUM PO, Take by mouth daily., Disp: , Rfl:   Allergies  Allergen Reactions   Keflex [Cephalexin] Other (See Comments)    Causes yeast infections         Objective:  Physical Exam  General: AAO x3, NAD  Dermatological:  There are no open sores, no preulcerative lesions, no rash or signs of infection present.  Vascular: Dorsalis Pedis artery and Posterior Tibial artery pedal pulses are 2/4 bilateral with immedate capillary fill time. There is no pain with calf compression, swelling, warmth, erythema.   Neruologic: Grossly intact via light touch bilateral.  Negative Tinel sign.  Musculoskeletal: Decreased medial arch height.  There is tenderness palpation on dorsal aspect the midfoot along the course of the Lisfranc joint.  There is no area of pinpoint tenderness but mild diffuse tenderness in the dorsal foot.  There is trace edema.  No erythema or warmth.  Muscular strength 5/5 in all groups tested bilateral.  Gait: Unassisted, Nonantalgic.       Assessment:   Flatfoot, capsulitis     Plan:  -Treatment options discussed including all alternatives, risks, and complications -Etiology of symptoms were discussed -X-rays were obtained and reviewed with the patient.  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  Decreased medial arch height.  Heel spur present. -Prescribe meloxicam discussed side effects.  Discussed shoe modifications and good shoes with good arch supports.  Discussed wearing stiffer soled shoes as well.  Discussed releasing her shoes to avoid any pressure to the top of her foot to help avoid any pressure, nerve issues. -Discussed  steroid injection.  Trula Slade DPM

## 2020-09-07 ENCOUNTER — Other Ambulatory Visit: Payer: Self-pay | Admitting: Podiatry

## 2020-09-07 DIAGNOSIS — R7301 Impaired fasting glucose: Secondary | ICD-10-CM | POA: Diagnosis not present

## 2020-09-07 DIAGNOSIS — Z79899 Other long term (current) drug therapy: Secondary | ICD-10-CM | POA: Diagnosis not present

## 2020-09-07 DIAGNOSIS — M778 Other enthesopathies, not elsewhere classified: Secondary | ICD-10-CM

## 2020-09-07 DIAGNOSIS — E785 Hyperlipidemia, unspecified: Secondary | ICD-10-CM | POA: Diagnosis not present

## 2020-09-14 DIAGNOSIS — E785 Hyperlipidemia, unspecified: Secondary | ICD-10-CM | POA: Diagnosis not present

## 2020-09-14 DIAGNOSIS — M189 Osteoarthritis of first carpometacarpal joint, unspecified: Secondary | ICD-10-CM | POA: Diagnosis not present

## 2020-09-14 DIAGNOSIS — M1990 Unspecified osteoarthritis, unspecified site: Secondary | ICD-10-CM | POA: Diagnosis not present

## 2020-10-19 DIAGNOSIS — Z23 Encounter for immunization: Secondary | ICD-10-CM | POA: Diagnosis not present

## 2020-10-26 ENCOUNTER — Ambulatory Visit: Payer: Medicare PPO | Admitting: Podiatry

## 2020-10-26 ENCOUNTER — Other Ambulatory Visit: Payer: Self-pay

## 2020-10-26 ENCOUNTER — Encounter: Payer: Self-pay | Admitting: Podiatry

## 2020-10-26 DIAGNOSIS — G629 Polyneuropathy, unspecified: Secondary | ICD-10-CM

## 2020-10-26 DIAGNOSIS — M2142 Flat foot [pes planus] (acquired), left foot: Secondary | ICD-10-CM

## 2020-10-26 DIAGNOSIS — M2141 Flat foot [pes planus] (acquired), right foot: Secondary | ICD-10-CM | POA: Diagnosis not present

## 2020-10-26 DIAGNOSIS — M778 Other enthesopathies, not elsewhere classified: Secondary | ICD-10-CM | POA: Diagnosis not present

## 2020-10-26 MED ORDER — GABAPENTIN 100 MG PO CAPS: 100.0000 mg | ORAL_CAPSULE | Freq: Every day | ORAL | 0 refills | Status: DC

## 2020-10-26 NOTE — Patient Instructions (Signed)
Gabapentin Capsules or Tablets What is this medication? GABAPENTIN (GA ba pen tin) treats nerve pain. It may also be used to prevent and control seizures in people with epilepsy. It works by calming overactivenerves in your body. This medicine may be used for other purposes; ask your health care provider orpharmacist if you have questions. COMMON BRAND NAME(S): Active-PAC with Gabapentin, Gabarone, Gralise, Neurontin What should I tell my care team before I take this medication? They need to know if you have any of these conditions: Alcohol or substance use disorder Kidney disease Lung or breathing disease Suicidal thoughts, plans, or attempt; a previous suicide attempt by you or a family member An unusual or allergic reaction to gabapentin, other medications, foods, dyes, or preservatives Pregnant or trying to get pregnant Breast-feeding How should I use this medication? Take this medication by mouth with a glass of water. Follow the directions on the prescription label. You can take it with or without food. If it upsets your stomach, take it with food. Take your medication at regular intervals. Do not take it more often than directed. Do not stop taking except on your care team'sadvice. If you are directed to break the 600 or 800 mg tablets in half as part of your dose, the extra half tablet should be used for the next dose. If you have notused the extra half tablet within 28 days, it should be thrown away. A special MedGuide will be given to you by the pharmacist with eachprescription and refill. Be sure to read this information carefully each time. Talk to your care team about the use of this medication in children. While this medication may be prescribed for children as young as 3 years for selectedconditions, precautions do apply. Overdosage: If you think you have taken too much of this medicine contact apoison control center or emergency room at once. NOTE: This medicine is only for you.  Do not share this medicine with others. What if I miss a dose? If you miss a dose, take it as soon as you can. If it is almost time for yournext dose, take only that dose. Do not take double or extra doses. What may interact with this medication? Alcohol Antihistamines for allergy, cough, and cold Certain medications for anxiety or sleep Certain medications for depression like amitriptyline, fluoxetine, sertraline Certain medications for seizures like phenobarbital, primidone Certain medications for stomach problems General anesthetics like halothane, isoflurane, methoxyflurane, propofol Local anesthetics like lidocaine, pramoxine, tetracaine Medications that relax muscles for surgery Narcotic medications for pain Phenothiazines like chlorpromazine, mesoridazine, prochlorperazine, thioridazine This list may not describe all possible interactions. Give your health care provider a list of all the medicines, herbs, non-prescription drugs, or dietary supplements you use. Also tell them if you smoke, drink alcohol, or use illegaldrugs. Some items may interact with your medicine. What should I watch for while using this medication? Visit your care team for regular checks on your progress. You may want to keep a record at home of how you feel your condition is responding to treatment. You may want to share this information with your care team at each visit. You should contact your care team if your seizures get worse or if you have any new types of seizures. Do not stop taking this medication or any of your seizure medications unless instructed by your care team. Stopping your medicationsuddenly can increase your seizures or their severity. This medication may cause serious skin reactions. They can happen weeks to months after starting the   medication. Contact your care team right away if you notice fevers or flu-like symptoms with a rash. The rash may be red or purple and then turn into blisters or  peeling of the skin. Or, you might notice a red rash with swelling of the face, lips or lymph nodes in your neck or under yourarms. Wear a medical identification bracelet or chain if you are taking thismedication for seizures. Carry a card that lists all your medications. You may get drowsy, dizzy, or have blurred vision. Do not drive, use machinery, or do anything that needs mental alertness until you know how this medication affects you. To reduce dizzy or fainting spells, do not sit or stand up quickly, especially if you are an older patient. Alcohol can increasedrowsiness and dizziness. Your mouth may get dry. Chewing sugarless gum or sucking hard candy, anddrinking plenty of water may help. Watch for new or worsening thoughts of suicide or depression. This includes sudden changes in mood, behaviors, or thoughts. These changes can happen at any time but are more common in the beginning of treatment or after a change in dose. Call your care team right away if you experience these thoughts orworsening depression. If you become pregnant while using this medication, you may enroll in the [place] American Antiepileptic Drug Pregnancy Registry by calling 1-888-233-2334. This registry collects information about the safety of antiepileptic medication useduring pregnancy. What side effects may I notice from receiving this medication? Side effects that you should report to your care team as soon as possible: Allergic reactions or angioedema-skin rash, itching, hives, swelling of the face, eyes, lips, tongue, arms, or legs, trouble swallowing or breathing Rash, fever, and swollen lymph nodes Thoughts of suicide or self harm, worsening mood, feelings of depression Trouble breathing Unusual changes in mood or behavior in children after use such as difficulty concentrating, hostility, or restlessness Side effects that usually do not require medical attention (report to your careteam if they continue or are  bothersome): Dizziness Drowsiness Nausea Swelling of ankles, feet, or hands Vomiting This list may not describe all possible side effects. Call your doctor for medical advice about side effects. You may report side effects to FDA at1-800-FDA-1088. Where should I keep my medication? Keep out of reach of children and pets. Store at room temperature between 15 and 30 degrees C (59 and 86 degrees F).Get rid of any unused medication after the expiration date. This medication may cause accidental overdose and death if taken by other adults, children, or pets. Mix any unused medication with a substance like cat litter or coffee grounds. Then throw the medication away in a sealed containerlike a sealed bag or a coffee can with a lid. NOTE: This sheet is a summary. It may not cover all possible information. If you have questions about this medicine, talk to your doctor, pharmacist, orhealth care provider.  2022 Elsevier/Gold Standard (2019-11-26 12:16:18)  

## 2020-10-31 NOTE — Progress Notes (Signed)
Subjective: 76 year old female presents the office today for follow-up evaluation concerns of continued discomfort on her feet with the right side worse than left.  She states the medication does help when she takes it.  The inserts, power steps are comfortable but still having discomfort.  She says the insert did cause a small corn on the fifth toe.  She is also describing burning into her toes and odd sensations to her feet.  Objective: AAO x3, NAD DP/PT pulses palpable bilaterally, CRT less than 3 seconds Sensation decreased with Semmes Weinstein monofilament There is a decrease in medial arch height upon weightbearing.  The majority discomfort is on the dorsal aspect of the midfoot but also on the plantar aspect of the foot on the arch of the on the plantar fascia.  There is no area of pinpoint tenderness.  There is no edema, erythema.  MMT 5/5. Bunions present. 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  -I reviewed the x-rays which showed flatfoot changes without any evidence of acute fracture.  I discussed custom orthotic for now just continue the power steps.  Discussed physical therapy but she can try doing this on her own at home exercises provided. -Also she is having burning into her toes symmetrical on both sides.  We will start a low-dose gabapentin to see if this is helpful as I do think some of her symptoms are nerve related.  Discussed side effects of the medication.  If no improvement we will refill the meloxicam. -Patient encouraged to call the office with any questions, concerns, change in symptoms.   Trula Slade DPM

## 2020-11-22 ENCOUNTER — Telehealth: Payer: Self-pay | Admitting: *Deleted

## 2020-11-22 NOTE — Telephone Encounter (Signed)
Patient is calling to let the doctor know that the Gabapentin-100 mg is not doing anything,hurts worse. Please advise.

## 2020-11-25 NOTE — Telephone Encounter (Signed)
Hey Dr Jacqualyn Posey- I called the patient this morning and the patient does not want to take the gabapentin any more and would like to take the meloxicam instead and you had prescribed a 14 day supply which did help. Thanks Lattie Haw

## 2020-11-26 ENCOUNTER — Other Ambulatory Visit: Payer: Self-pay | Admitting: Podiatry

## 2020-11-26 MED ORDER — MELOXICAM 7.5 MG PO TABS: 7.5000 mg | ORAL_TABLET | Freq: Every day | ORAL | 0 refills | Status: DC | PRN

## 2020-12-21 ENCOUNTER — Other Ambulatory Visit: Payer: Self-pay

## 2020-12-21 ENCOUNTER — Encounter: Payer: Self-pay | Admitting: Podiatry

## 2020-12-21 ENCOUNTER — Ambulatory Visit: Payer: Medicare PPO | Admitting: Podiatry

## 2020-12-21 DIAGNOSIS — M2142 Flat foot [pes planus] (acquired), left foot: Secondary | ICD-10-CM | POA: Diagnosis not present

## 2020-12-21 DIAGNOSIS — M19079 Primary osteoarthritis, unspecified ankle and foot: Secondary | ICD-10-CM

## 2020-12-21 DIAGNOSIS — M778 Other enthesopathies, not elsewhere classified: Secondary | ICD-10-CM

## 2020-12-21 DIAGNOSIS — M2141 Flat foot [pes planus] (acquired), right foot: Secondary | ICD-10-CM | POA: Diagnosis not present

## 2020-12-21 MED ORDER — MELOXICAM 7.5 MG PO TABS: 7.5000 mg | ORAL_TABLET | Freq: Every day | ORAL | 0 refills | Status: DC | PRN

## 2020-12-22 NOTE — Progress Notes (Signed)
Subjective: 75 year old female presents the office today for follow-up evaluation concerns of continued discomfort on her feet with the right side worse than left.  She states that she is still not any better and having discomfort.  This and the meloxicam was more helpful than the gabapentin.  I have also, she also states that she was not feeling better so I changed her from past over the bunions, hammertoes and she states the original ones were more helpful.  No swelling or recent injury.  She does describe some numbness, sharp pains, burning to her feet at times.  The gabapentin was not helpful previously.  Objective: AAO x3, NAD DP/PT pulses palpable bilaterally, CRT less than 3 seconds Sensation decreased with Semmes Weinstein monofilament There is a decrease in medial arch height upon weightbearing.  She gets discomfort on the dorsal aspect of the midfoot and Lisfranc joint.  There is no specific area of pinpoint tenderness.  Bunions, hammertoes are present.  She is frustrated that the bunions and hammertoes are still present. 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  -I again today had a long discussion with her regarding the etiology of her pain.  Do think it is a combination of her flat feet with arthritis.  We dispensed power step inserts which have not been overly helpful.  We discussed custom inserts.  Discussed insurance would likely not cover them but I did give her the codes to check with the insurance in case.  She wants to try copper fit arch support.  I dispensed the original pads for the hammertoe.  I discussed with her without doing surgery the hammertoes and bunions and hammertoes are not likely to correct itself. -Meloxicam as needed we will try to  get blood work from her primary care physician to check kidney function. -She was confused on the different medications I discussed well with the gabapentin versus the meloxicam.  The meloxicam seem to do better so continue that. -Patient encouraged to call the office with any questions, concerns, change in symptoms.   Trula Slade DPM

## 2020-12-23 DIAGNOSIS — D225 Melanocytic nevi of trunk: Secondary | ICD-10-CM | POA: Diagnosis not present

## 2020-12-23 DIAGNOSIS — Z1283 Encounter for screening for malignant neoplasm of skin: Secondary | ICD-10-CM | POA: Diagnosis not present

## 2020-12-23 DIAGNOSIS — L728 Other follicular cysts of the skin and subcutaneous tissue: Secondary | ICD-10-CM | POA: Diagnosis not present

## 2020-12-23 DIAGNOSIS — L708 Other acne: Secondary | ICD-10-CM | POA: Diagnosis not present

## 2020-12-23 DIAGNOSIS — L820 Inflamed seborrheic keratosis: Secondary | ICD-10-CM | POA: Diagnosis not present

## 2020-12-28 ENCOUNTER — Telehealth: Payer: Self-pay | Admitting: *Deleted

## 2020-12-28 NOTE — Telephone Encounter (Signed)
Called and spoke with Kathlee Nations from Dr Knute Neu office today and they are sending over the blood work today and the fax they are sending it to is [phone]. Lattie Haw

## 2020-12-28 NOTE — Telephone Encounter (Signed)
-----   Message from Trula Slade, DPM sent at 12/22/2020  2:36 PM EST ----- Can we try to get her most recent blood work from her primary care physician to have on file?  Thank you.

## 2021-01-17 ENCOUNTER — Other Ambulatory Visit: Payer: Self-pay | Admitting: Podiatry

## 2021-02-15 ENCOUNTER — Ambulatory Visit: Payer: Medicare PPO | Admitting: Podiatry

## 2021-02-15 ENCOUNTER — Other Ambulatory Visit: Payer: Self-pay

## 2021-02-15 DIAGNOSIS — M2142 Flat foot [pes planus] (acquired), left foot: Secondary | ICD-10-CM

## 2021-02-15 DIAGNOSIS — M778 Other enthesopathies, not elsewhere classified: Secondary | ICD-10-CM | POA: Diagnosis not present

## 2021-02-15 DIAGNOSIS — M2141 Flat foot [pes planus] (acquired), right foot: Secondary | ICD-10-CM

## 2021-02-15 DIAGNOSIS — M19079 Primary osteoarthritis, unspecified ankle and foot: Secondary | ICD-10-CM

## 2021-02-21 NOTE — Progress Notes (Signed)
Subjective: 76 year old female presents the office today for follow-up evaluation concerns of continued discomfort on her feet with the right side worse than left.  She says her foot still seems to hurt the same as what it has been.  Meloxicam does help.  She points on the top of the foot where she has majority discomfort.  No recent injury or trauma.  No increase in swelling.  She is frustrated having to drive the distance from her house to the office.  Objective: AAO x3, NAD DP/PT pulses palpable bilaterally, CRT less than 3 seconds Sensation somewhat decreased with Semmes Weinstein monofilament There is a decrease in medial arch height upon weightbearing.  She gets discomfort on the dorsal aspect of the midfoot and Lisfranc joint.  There is no specific area of pinpoint tenderness.  Bunions, hammertoes are present.  She is frustrated that the bunions and hammertoes are still present. 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  -Again discussed with her the x-rays and her foot foot.  At this point we tried Powerstep inserts.  It seemed to cause a corn on the fifth toe.  She is now increasing custom.  I dispensed a graphite inserts today.  Discussed that she is a smaller size let me know.  He is a size 9 and that is what I placed inside of her shoes, wait out she felt they were buckling some.  If needed to let me know and I can mail her a size 8.  Offered to get advanced imaging including MRI but she declines. -Patient encouraged to call the office with any questions, concerns, change in symptoms.   Trula Slade DPM

## 2021-03-25 ENCOUNTER — Other Ambulatory Visit: Payer: Self-pay | Admitting: Podiatry

## 2021-04-05 DIAGNOSIS — Z0001 Encounter for general adult medical examination with abnormal findings: Secondary | ICD-10-CM | POA: Diagnosis not present

## 2021-04-05 DIAGNOSIS — E785 Hyperlipidemia, unspecified: Secondary | ICD-10-CM | POA: Diagnosis not present

## 2021-04-25 ENCOUNTER — Other Ambulatory Visit: Payer: Self-pay | Admitting: Podiatry

## 2021-04-27 NOTE — Telephone Encounter (Signed)
Called and spoke with the daughter, said that she heard her mother say the other day that she did not want prescription refilled.

## 2021-05-26 ENCOUNTER — Other Ambulatory Visit (HOSPITAL_COMMUNITY): Payer: Self-pay | Admitting: Internal Medicine

## 2021-05-26 DIAGNOSIS — Z1231 Encounter for screening mammogram for malignant neoplasm of breast: Secondary | ICD-10-CM

## 2021-06-07 DIAGNOSIS — I700 Atherosclerosis of aorta: Secondary | ICD-10-CM | POA: Diagnosis not present

## 2021-06-07 DIAGNOSIS — Z79899 Other long term (current) drug therapy: Secondary | ICD-10-CM | POA: Diagnosis not present

## 2021-06-07 DIAGNOSIS — R7301 Impaired fasting glucose: Secondary | ICD-10-CM | POA: Diagnosis not present

## 2021-06-07 DIAGNOSIS — E785 Hyperlipidemia, unspecified: Secondary | ICD-10-CM | POA: Diagnosis not present

## 2021-06-07 DIAGNOSIS — M1990 Unspecified osteoarthritis, unspecified site: Secondary | ICD-10-CM | POA: Diagnosis not present

## 2021-06-14 DIAGNOSIS — I700 Atherosclerosis of aorta: Secondary | ICD-10-CM | POA: Diagnosis not present

## 2021-06-14 DIAGNOSIS — E785 Hyperlipidemia, unspecified: Secondary | ICD-10-CM | POA: Diagnosis not present

## 2021-06-14 DIAGNOSIS — R7301 Impaired fasting glucose: Secondary | ICD-10-CM | POA: Diagnosis not present

## 2021-06-14 DIAGNOSIS — Z0001 Encounter for general adult medical examination with abnormal findings: Secondary | ICD-10-CM | POA: Diagnosis not present

## 2021-06-14 DIAGNOSIS — M810 Age-related osteoporosis without current pathological fracture: Secondary | ICD-10-CM | POA: Diagnosis not present

## 2021-06-14 DIAGNOSIS — Z6821 Body mass index (BMI) 21.0-21.9, adult: Secondary | ICD-10-CM | POA: Diagnosis not present

## 2021-06-23 DIAGNOSIS — L728 Other follicular cysts of the skin and subcutaneous tissue: Secondary | ICD-10-CM | POA: Diagnosis not present

## 2021-06-23 DIAGNOSIS — D485 Neoplasm of uncertain behavior of skin: Secondary | ICD-10-CM | POA: Diagnosis not present

## 2021-06-23 DIAGNOSIS — D225 Melanocytic nevi of trunk: Secondary | ICD-10-CM | POA: Diagnosis not present

## 2021-06-23 DIAGNOSIS — H61002 Unspecified perichondritis of left external ear: Secondary | ICD-10-CM | POA: Diagnosis not present

## 2021-06-23 DIAGNOSIS — Z1283 Encounter for screening for malignant neoplasm of skin: Secondary | ICD-10-CM | POA: Diagnosis not present

## 2021-07-04 ENCOUNTER — Ambulatory Visit (HOSPITAL_COMMUNITY)
Admission: RE | Admit: 2021-07-04 | Discharge: 2021-07-04 | Disposition: A | Discharge status: Home / Self Care | Payer: Medicare PPO | Source: Ambulatory Visit | Attending: Internal Medicine | Admitting: Internal Medicine

## 2021-07-04 DIAGNOSIS — Z1231 Encounter for screening mammogram for malignant neoplasm of breast: Secondary | ICD-10-CM | POA: Diagnosis not present

## 2021-07-12 DIAGNOSIS — R030 Elevated blood-pressure reading, without diagnosis of hypertension: Secondary | ICD-10-CM | POA: Diagnosis not present

## 2021-07-12 DIAGNOSIS — F411 Generalized anxiety disorder: Secondary | ICD-10-CM | POA: Diagnosis not present

## 2021-08-05 ENCOUNTER — Other Ambulatory Visit: Payer: Self-pay | Admitting: Podiatry

## 2021-09-04 ENCOUNTER — Other Ambulatory Visit: Payer: Self-pay | Admitting: Podiatry

## 2021-09-20 DIAGNOSIS — M9902 Segmental and somatic dysfunction of thoracic region: Secondary | ICD-10-CM | POA: Diagnosis not present

## 2021-09-20 DIAGNOSIS — M47812 Spondylosis without myelopathy or radiculopathy, cervical region: Secondary | ICD-10-CM | POA: Diagnosis not present

## 2021-09-20 DIAGNOSIS — M9901 Segmental and somatic dysfunction of cervical region: Secondary | ICD-10-CM | POA: Diagnosis not present

## 2021-09-20 DIAGNOSIS — M9903 Segmental and somatic dysfunction of lumbar region: Secondary | ICD-10-CM | POA: Diagnosis not present

## 2021-09-20 DIAGNOSIS — M47816 Spondylosis without myelopathy or radiculopathy, lumbar region: Secondary | ICD-10-CM | POA: Diagnosis not present

## 2021-09-20 DIAGNOSIS — M546 Pain in thoracic spine: Secondary | ICD-10-CM | POA: Diagnosis not present

## 2021-09-29 DIAGNOSIS — M9903 Segmental and somatic dysfunction of lumbar region: Secondary | ICD-10-CM | POA: Diagnosis not present

## 2021-09-29 DIAGNOSIS — M47812 Spondylosis without myelopathy or radiculopathy, cervical region: Secondary | ICD-10-CM | POA: Diagnosis not present

## 2021-09-29 DIAGNOSIS — M546 Pain in thoracic spine: Secondary | ICD-10-CM | POA: Diagnosis not present

## 2021-09-29 DIAGNOSIS — M9902 Segmental and somatic dysfunction of thoracic region: Secondary | ICD-10-CM | POA: Diagnosis not present

## 2021-09-29 DIAGNOSIS — M47816 Spondylosis without myelopathy or radiculopathy, lumbar region: Secondary | ICD-10-CM | POA: Diagnosis not present

## 2021-09-29 DIAGNOSIS — M9901 Segmental and somatic dysfunction of cervical region: Secondary | ICD-10-CM | POA: Diagnosis not present

## 2021-10-03 DIAGNOSIS — D225 Melanocytic nevi of trunk: Secondary | ICD-10-CM | POA: Diagnosis not present

## 2021-10-03 DIAGNOSIS — L821 Other seborrheic keratosis: Secondary | ICD-10-CM | POA: Diagnosis not present

## 2021-10-03 DIAGNOSIS — L820 Inflamed seborrheic keratosis: Secondary | ICD-10-CM | POA: Diagnosis not present

## 2021-10-03 DIAGNOSIS — Z1283 Encounter for screening for malignant neoplasm of skin: Secondary | ICD-10-CM | POA: Diagnosis not present

## 2021-10-04 DIAGNOSIS — M9901 Segmental and somatic dysfunction of cervical region: Secondary | ICD-10-CM | POA: Diagnosis not present

## 2021-10-04 DIAGNOSIS — M546 Pain in thoracic spine: Secondary | ICD-10-CM | POA: Diagnosis not present

## 2021-10-04 DIAGNOSIS — M47816 Spondylosis without myelopathy or radiculopathy, lumbar region: Secondary | ICD-10-CM | POA: Diagnosis not present

## 2021-10-04 DIAGNOSIS — M9903 Segmental and somatic dysfunction of lumbar region: Secondary | ICD-10-CM | POA: Diagnosis not present

## 2021-10-04 DIAGNOSIS — M47812 Spondylosis without myelopathy or radiculopathy, cervical region: Secondary | ICD-10-CM | POA: Diagnosis not present

## 2021-10-04 DIAGNOSIS — M9902 Segmental and somatic dysfunction of thoracic region: Secondary | ICD-10-CM | POA: Diagnosis not present

## 2021-10-11 DIAGNOSIS — M9902 Segmental and somatic dysfunction of thoracic region: Secondary | ICD-10-CM | POA: Diagnosis not present

## 2021-10-11 DIAGNOSIS — M9901 Segmental and somatic dysfunction of cervical region: Secondary | ICD-10-CM | POA: Diagnosis not present

## 2021-10-11 DIAGNOSIS — M47816 Spondylosis without myelopathy or radiculopathy, lumbar region: Secondary | ICD-10-CM | POA: Diagnosis not present

## 2021-10-11 DIAGNOSIS — M546 Pain in thoracic spine: Secondary | ICD-10-CM | POA: Diagnosis not present

## 2021-10-11 DIAGNOSIS — M47812 Spondylosis without myelopathy or radiculopathy, cervical region: Secondary | ICD-10-CM | POA: Diagnosis not present

## 2021-10-11 DIAGNOSIS — M9903 Segmental and somatic dysfunction of lumbar region: Secondary | ICD-10-CM | POA: Diagnosis not present

## 2021-10-12 DIAGNOSIS — R030 Elevated blood-pressure reading, without diagnosis of hypertension: Secondary | ICD-10-CM | POA: Diagnosis not present

## 2021-10-12 DIAGNOSIS — F411 Generalized anxiety disorder: Secondary | ICD-10-CM | POA: Diagnosis not present

## 2021-10-20 DIAGNOSIS — M546 Pain in thoracic spine: Secondary | ICD-10-CM | POA: Diagnosis not present

## 2021-10-20 DIAGNOSIS — M9903 Segmental and somatic dysfunction of lumbar region: Secondary | ICD-10-CM | POA: Diagnosis not present

## 2021-10-20 DIAGNOSIS — M47812 Spondylosis without myelopathy or radiculopathy, cervical region: Secondary | ICD-10-CM | POA: Diagnosis not present

## 2021-10-20 DIAGNOSIS — M9902 Segmental and somatic dysfunction of thoracic region: Secondary | ICD-10-CM | POA: Diagnosis not present

## 2021-10-20 DIAGNOSIS — M47816 Spondylosis without myelopathy or radiculopathy, lumbar region: Secondary | ICD-10-CM | POA: Diagnosis not present

## 2021-10-20 DIAGNOSIS — M9901 Segmental and somatic dysfunction of cervical region: Secondary | ICD-10-CM | POA: Diagnosis not present

## 2021-10-25 DIAGNOSIS — M546 Pain in thoracic spine: Secondary | ICD-10-CM | POA: Diagnosis not present

## 2021-10-25 DIAGNOSIS — M47812 Spondylosis without myelopathy or radiculopathy, cervical region: Secondary | ICD-10-CM | POA: Diagnosis not present

## 2021-10-25 DIAGNOSIS — M47816 Spondylosis without myelopathy or radiculopathy, lumbar region: Secondary | ICD-10-CM | POA: Diagnosis not present

## 2021-10-25 DIAGNOSIS — M9903 Segmental and somatic dysfunction of lumbar region: Secondary | ICD-10-CM | POA: Diagnosis not present

## 2021-10-25 DIAGNOSIS — M9902 Segmental and somatic dysfunction of thoracic region: Secondary | ICD-10-CM | POA: Diagnosis not present

## 2021-10-25 DIAGNOSIS — M9901 Segmental and somatic dysfunction of cervical region: Secondary | ICD-10-CM | POA: Diagnosis not present

## 2021-10-27 DIAGNOSIS — M47812 Spondylosis without myelopathy or radiculopathy, cervical region: Secondary | ICD-10-CM | POA: Diagnosis not present

## 2021-10-27 DIAGNOSIS — M47816 Spondylosis without myelopathy or radiculopathy, lumbar region: Secondary | ICD-10-CM | POA: Diagnosis not present

## 2021-10-27 DIAGNOSIS — M546 Pain in thoracic spine: Secondary | ICD-10-CM | POA: Diagnosis not present

## 2021-10-27 DIAGNOSIS — Z23 Encounter for immunization: Secondary | ICD-10-CM | POA: Diagnosis not present

## 2021-10-27 DIAGNOSIS — M9902 Segmental and somatic dysfunction of thoracic region: Secondary | ICD-10-CM | POA: Diagnosis not present

## 2021-10-27 DIAGNOSIS — M9901 Segmental and somatic dysfunction of cervical region: Secondary | ICD-10-CM | POA: Diagnosis not present

## 2021-10-27 DIAGNOSIS — M9903 Segmental and somatic dysfunction of lumbar region: Secondary | ICD-10-CM | POA: Diagnosis not present

## 2021-10-28 ENCOUNTER — Other Ambulatory Visit (HOSPITAL_COMMUNITY): Payer: Self-pay | Admitting: Internal Medicine

## 2021-10-28 DIAGNOSIS — M545 Low back pain, unspecified: Secondary | ICD-10-CM

## 2021-10-28 DIAGNOSIS — M79606 Pain in leg, unspecified: Secondary | ICD-10-CM

## 2021-11-01 DIAGNOSIS — M47816 Spondylosis without myelopathy or radiculopathy, lumbar region: Secondary | ICD-10-CM | POA: Diagnosis not present

## 2021-11-01 DIAGNOSIS — M9903 Segmental and somatic dysfunction of lumbar region: Secondary | ICD-10-CM | POA: Diagnosis not present

## 2021-11-01 DIAGNOSIS — M546 Pain in thoracic spine: Secondary | ICD-10-CM | POA: Diagnosis not present

## 2021-11-01 DIAGNOSIS — M9902 Segmental and somatic dysfunction of thoracic region: Secondary | ICD-10-CM | POA: Diagnosis not present

## 2021-11-01 DIAGNOSIS — M47812 Spondylosis without myelopathy or radiculopathy, cervical region: Secondary | ICD-10-CM | POA: Diagnosis not present

## 2021-11-01 DIAGNOSIS — M9901 Segmental and somatic dysfunction of cervical region: Secondary | ICD-10-CM | POA: Diagnosis not present

## 2021-11-03 DIAGNOSIS — M9901 Segmental and somatic dysfunction of cervical region: Secondary | ICD-10-CM | POA: Diagnosis not present

## 2021-11-03 DIAGNOSIS — M9902 Segmental and somatic dysfunction of thoracic region: Secondary | ICD-10-CM | POA: Diagnosis not present

## 2021-11-03 DIAGNOSIS — M546 Pain in thoracic spine: Secondary | ICD-10-CM | POA: Diagnosis not present

## 2021-11-03 DIAGNOSIS — M9903 Segmental and somatic dysfunction of lumbar region: Secondary | ICD-10-CM | POA: Diagnosis not present

## 2021-11-03 DIAGNOSIS — M47812 Spondylosis without myelopathy or radiculopathy, cervical region: Secondary | ICD-10-CM | POA: Diagnosis not present

## 2021-11-03 DIAGNOSIS — M47816 Spondylosis without myelopathy or radiculopathy, lumbar region: Secondary | ICD-10-CM | POA: Diagnosis not present

## 2021-11-08 DIAGNOSIS — M9902 Segmental and somatic dysfunction of thoracic region: Secondary | ICD-10-CM | POA: Diagnosis not present

## 2021-11-08 DIAGNOSIS — M9903 Segmental and somatic dysfunction of lumbar region: Secondary | ICD-10-CM | POA: Diagnosis not present

## 2021-11-08 DIAGNOSIS — M47812 Spondylosis without myelopathy or radiculopathy, cervical region: Secondary | ICD-10-CM | POA: Diagnosis not present

## 2021-11-08 DIAGNOSIS — M546 Pain in thoracic spine: Secondary | ICD-10-CM | POA: Diagnosis not present

## 2021-11-08 DIAGNOSIS — M9901 Segmental and somatic dysfunction of cervical region: Secondary | ICD-10-CM | POA: Diagnosis not present

## 2021-11-08 DIAGNOSIS — M47816 Spondylosis without myelopathy or radiculopathy, lumbar region: Secondary | ICD-10-CM | POA: Diagnosis not present

## 2021-11-10 DIAGNOSIS — M47812 Spondylosis without myelopathy or radiculopathy, cervical region: Secondary | ICD-10-CM | POA: Diagnosis not present

## 2021-11-10 DIAGNOSIS — M546 Pain in thoracic spine: Secondary | ICD-10-CM | POA: Diagnosis not present

## 2021-11-10 DIAGNOSIS — M9901 Segmental and somatic dysfunction of cervical region: Secondary | ICD-10-CM | POA: Diagnosis not present

## 2021-11-10 DIAGNOSIS — M9902 Segmental and somatic dysfunction of thoracic region: Secondary | ICD-10-CM | POA: Diagnosis not present

## 2021-11-10 DIAGNOSIS — M9903 Segmental and somatic dysfunction of lumbar region: Secondary | ICD-10-CM | POA: Diagnosis not present

## 2021-11-10 DIAGNOSIS — M47816 Spondylosis without myelopathy or radiculopathy, lumbar region: Secondary | ICD-10-CM | POA: Diagnosis not present

## 2021-11-15 DIAGNOSIS — M546 Pain in thoracic spine: Secondary | ICD-10-CM | POA: Diagnosis not present

## 2021-11-15 DIAGNOSIS — M9903 Segmental and somatic dysfunction of lumbar region: Secondary | ICD-10-CM | POA: Diagnosis not present

## 2021-11-15 DIAGNOSIS — M9902 Segmental and somatic dysfunction of thoracic region: Secondary | ICD-10-CM | POA: Diagnosis not present

## 2021-11-15 DIAGNOSIS — M9901 Segmental and somatic dysfunction of cervical region: Secondary | ICD-10-CM | POA: Diagnosis not present

## 2021-11-15 DIAGNOSIS — M47816 Spondylosis without myelopathy or radiculopathy, lumbar region: Secondary | ICD-10-CM | POA: Diagnosis not present

## 2021-11-15 DIAGNOSIS — M47812 Spondylosis without myelopathy or radiculopathy, cervical region: Secondary | ICD-10-CM | POA: Diagnosis not present

## 2021-11-23 ENCOUNTER — Ambulatory Visit (HOSPITAL_COMMUNITY)
Admission: RE | Admit: 2021-11-23 | Discharge: 2021-11-23 | Disposition: A | Discharge status: Home / Self Care | Payer: Medicare PPO | Source: Ambulatory Visit | Attending: Internal Medicine | Admitting: Internal Medicine

## 2021-11-23 DIAGNOSIS — M545 Low back pain, unspecified: Secondary | ICD-10-CM | POA: Insufficient documentation

## 2021-11-23 DIAGNOSIS — M5136 Other intervertebral disc degeneration, lumbar region: Secondary | ICD-10-CM | POA: Diagnosis not present

## 2021-11-23 DIAGNOSIS — M79606 Pain in leg, unspecified: Secondary | ICD-10-CM | POA: Insufficient documentation

## 2021-11-23 MED ORDER — GADOBUTROL 1 MMOL/ML IV SOLN
6.0000 mL | Freq: Once | INTRAVENOUS | Status: AC | PRN
  Administered 2021-11-23: 6 mL via INTRAVENOUS

## 2021-12-06 ENCOUNTER — Other Ambulatory Visit: Payer: Self-pay | Admitting: Podiatry

## 2022-01-07 ENCOUNTER — Other Ambulatory Visit: Payer: Self-pay | Admitting: Podiatry

## 2022-01-10 DIAGNOSIS — M47816 Spondylosis without myelopathy or radiculopathy, lumbar region: Secondary | ICD-10-CM | POA: Diagnosis not present

## 2022-01-10 DIAGNOSIS — M47817 Spondylosis without myelopathy or radiculopathy, lumbosacral region: Secondary | ICD-10-CM | POA: Diagnosis not present

## 2022-01-10 DIAGNOSIS — M4316 Spondylolisthesis, lumbar region: Secondary | ICD-10-CM | POA: Diagnosis not present

## 2022-01-10 DIAGNOSIS — M48062 Spinal stenosis, lumbar region with neurogenic claudication: Secondary | ICD-10-CM | POA: Diagnosis not present

## 2022-02-12 ENCOUNTER — Other Ambulatory Visit: Payer: Self-pay | Admitting: Podiatry

## 2022-02-13 DIAGNOSIS — R030 Elevated blood-pressure reading, without diagnosis of hypertension: Secondary | ICD-10-CM | POA: Diagnosis not present

## 2022-02-13 DIAGNOSIS — M5416 Radiculopathy, lumbar region: Secondary | ICD-10-CM | POA: Diagnosis not present

## 2022-02-27 DIAGNOSIS — H04123 Dry eye syndrome of bilateral lacrimal glands: Secondary | ICD-10-CM | POA: Diagnosis not present

## 2022-03-17 ENCOUNTER — Other Ambulatory Visit: Payer: Self-pay | Admitting: Podiatry

## 2022-03-21 DIAGNOSIS — M4317 Spondylolisthesis, lumbosacral region: Secondary | ICD-10-CM | POA: Diagnosis not present

## 2022-03-21 DIAGNOSIS — M48062 Spinal stenosis, lumbar region with neurogenic claudication: Secondary | ICD-10-CM | POA: Diagnosis not present

## 2022-03-21 DIAGNOSIS — M4316 Spondylolisthesis, lumbar region: Secondary | ICD-10-CM | POA: Diagnosis not present

## 2022-03-27 ENCOUNTER — Other Ambulatory Visit: Payer: Self-pay | Admitting: Pulmonary Disease

## 2022-04-05 ENCOUNTER — Other Ambulatory Visit: Payer: Self-pay | Admitting: Neurosurgery

## 2022-04-26 NOTE — Pre-Procedure Instructions (Addendum)
Surgical Instructions    Your procedure is scheduled on May 03, 2022.  Report to [facility] Main Entrance "A" at 6:30 A.M., then check in with the Admitting office.  Call this number if you have problems the morning of surgery:  [phone]  If you have any questions prior to your surgery date call [phone]: Open Monday-Friday 8am-4pm If you experience any cold or flu symptoms such as cough, fever, chills, shortness of breath, etc. between now and your scheduled surgery, please notify us at the above number.     Remember:  Do not eat or drink after midnight the night before your surgery     Take these medicines the morning of surgery with A SIP OF WATER:  acetaminophen (TYLENOL) - may take if needed  carboxymethylcellul-glycerin (OPTIVE) ophthalmic solution - may take if needed     As of today, STOP taking any Aspirin (unless otherwise instructed by your surgeon) Aleve, Naproxen, Ibuprofen, Motrin, Advil, Goody's, BC's, all herbal medications, fish oil, and all vitamins. This includes your medication: meloxicam (MOBIC).                     Do NOT Smoke (Tobacco/Vaping) for 24 hours prior to your procedure.  If you use a CPAP at night, you may bring your mask/headgear for your overnight stay.   Contacts, glasses, piercing's, hearing aid's, dentures or partials may not be worn into surgery, please bring cases for these belongings.    For patients admitted to the hospital, discharge time will be determined by your treatment team.   Patients discharged the day of surgery will not be allowed to drive home, and someone needs to stay with them for 24 hours.  SURGICAL WAITING ROOM VISITATION Patients having surgery or a procedure may have no more than 2 support people in the waiting area - these visitors may rotate.   Children under the age of 68 must have an adult with them who is not the patient. If the patient needs to stay at the hospital during part of their recovery, the  visitor guidelines for inpatient rooms apply. Pre-op nurse will coordinate an appropriate time for 1 support person to accompany patient in pre-op.  This support person may not rotate.   Please refer to the [facility] website for the visitor guidelines for Inpatients (after your surgery is over and you are in a regular room).    Special instructions:   [place]- Preparing For Surgery  Before surgery, you can play an important role. Because skin is not sterile, your skin needs to be as free of germs as possible. You can reduce the number of germs on your skin by washing with CHG (chlorahexidine gluconate) Soap before surgery.  CHG is an antiseptic cleaner which kills germs and bonds with the skin to continue killing germs even after washing.    Oral Hygiene is also important to reduce your risk of infection.  Remember - BRUSH YOUR TEETH THE MORNING OF SURGERY WITH YOUR REGULAR TOOTHPASTE   Please read over the following fact sheets that you were given.    If you received a COVID test during your pre-op visit  it is requested that you wear a mask when out in public, stay away from anyone that may not be feeling well and notify your surgeon if you develop symptoms. If you have been in contact with anyone that has tested positive in the last 10 days please notify you surgeon.

## 2022-04-27 ENCOUNTER — Encounter (HOSPITAL_COMMUNITY)
Admission: RE | Admit: 2022-04-27 | Discharge: 2022-04-27 | Disposition: A | Discharge status: Home / Self Care | Payer: Medicare PPO | Source: Ambulatory Visit | Attending: Neurosurgery | Admitting: Neurosurgery

## 2022-04-27 ENCOUNTER — Other Ambulatory Visit: Payer: Self-pay

## 2022-04-27 ENCOUNTER — Encounter (HOSPITAL_COMMUNITY): Payer: Self-pay

## 2022-04-27 VITALS — BP 138/69 | HR 73 | Temp 98.2°F | Resp 16 | Ht 63.0 in | Wt 139.0 lb

## 2022-04-27 DIAGNOSIS — Z01818 Encounter for other preprocedural examination: Secondary | ICD-10-CM | POA: Insufficient documentation

## 2022-04-27 HISTORY — DX: Unspecified osteoarthritis, unspecified site: M19.90

## 2022-04-27 HISTORY — DX: Myoneural disorder, unspecified: G70.9

## 2022-04-27 HISTORY — DX: Headache, unspecified: R51.9

## 2022-04-27 LAB — SURGICAL PCR SCREEN
MRSA, PCR: NEGATIVE
Staphylococcus aureus: POSITIVE — AB

## 2022-04-27 LAB — CBC
HCT: 37.7 % (ref 36.0–46.0)
Hemoglobin: 12.5 g/dL (ref 12.0–15.0)
MCH: 31 pg (ref 26.0–34.0)
MCHC: 33.2 g/dL (ref 30.0–36.0)
MCV: 93.5 fL (ref 80.0–100.0)
Platelets: 239 10*3/uL (ref 150–400)
RBC: 4.03 MIL/uL (ref 3.87–5.11)
RDW: 11.9 % (ref 11.5–15.5)
WBC: 6.7 10*3/uL (ref 4.0–10.5)
nRBC: 0 % (ref 0.0–0.2)

## 2022-04-27 LAB — TYPE AND SCREEN
ABO/RH(D): A POS
Antibody Screen: NEGATIVE

## 2022-04-27 NOTE — Progress Notes (Signed)
PCP - Dr. Asencion Noble Cardiologist - denies  PPM/ICD - n/a Device Orders - n/a Rep Notified - n/a  Chest x-ray - n/a EKG - n/a Stress Test - denies ECHO - denies Cardiac Cath - denies  Sleep Study - denies CPAP - n/a  Fasting Blood Sugar - n/a Checks Blood Sugar _____ times a day- n/a  Last dose of GLP1 agonist-  n/a GLP1 instructions: n/a  Blood Thinner Instructions: n/a Aspirin Instructions: n/a  ERAS Protcol - NPO after midnight PRE-SURGERY Ensure or G2- n/a  COVID TEST- n/a   Anesthesia review: No  Patient denies shortness of breath, fever, cough and chest pain at PAT appointment   All instructions explained to the patient, with a verbal understanding of the material. Patient agrees to go over the instructions while at home for a better understanding. Patient also instructed to self quarantine after being tested for COVID-19. The opportunity to ask questions was provided.

## 2022-04-27 NOTE — Pre-Procedure Instructions (Signed)
Surgical Instructions    Your procedure is scheduled on May 03, 2022.  Report to [facility] Main Entrance "A" at 6:30 A.M., then check in with the Admitting office.  Call this number if you have problems the morning of surgery:  [phone]  If you have any questions prior to your surgery date call [phone]: Open Monday-Friday 8am-4pm If you experience any cold or flu symptoms such as cough, fever, chills, shortness of breath, etc. between now and your scheduled surgery, please notify us at the above number.     Remember:  Do not eat or drink after midnight the night before your surgery     Take these medicines the morning of surgery with A SIP OF WATER:  acetaminophen (TYLENOL) - may take if needed  carboxymethylcellul-glycerin (OPTIVE) ophthalmic solution - may take if needed     As of today, STOP taking any Aspirin (unless otherwise instructed by your surgeon) Aleve, Naproxen, Ibuprofen, Motrin, Advil, Goody's, BC's, all herbal medications, fish oil, and all vitamins. This includes your medication: meloxicam (MOBIC).                     Do NOT Smoke (Tobacco/Vaping) for 24 hours prior to your procedure.  If you use a CPAP at night, you may bring your mask/headgear for your overnight stay.   Contacts, glasses, piercing's, hearing aid's, dentures or partials may not be worn into surgery, please bring cases for these belongings.    For patients admitted to the hospital, discharge time will be determined by your treatment team.   Patients discharged the day of surgery will not be allowed to drive home, and someone needs to stay with them for 24 hours.  SURGICAL WAITING ROOM VISITATION Patients having surgery or a procedure may have no more than 2 support people in the waiting area - these visitors may rotate.   Children under the age of 71 must have an adult with them who is not the patient. If the patient needs to stay at the hospital during part of their recovery, the  visitor guidelines for inpatient rooms apply. Pre-op nurse will coordinate an appropriate time for 1 support person to accompany patient in pre-op.  This support person may not rotate.   Please refer to the [facility] website for the visitor guidelines for Inpatients (after your surgery is over and you are in a regular room).    Special instructions:   [place]- Preparing For Surgery  Before surgery, you can play an important role. Because skin is not sterile, your skin needs to be as free of germs as possible. You can reduce the number of germs on your skin by washing with CHG (chlorahexidine gluconate) Soap before surgery.  CHG is an antiseptic cleaner which kills germs and bonds with the skin to continue killing germs even after washing.    Please read over and follow the Pre-Operative 5 CHG Bath Instructions Sheet attached to this paper.   Oral Hygiene is also important to reduce your risk of infection.  Remember - BRUSH YOUR TEETH THE MORNING OF SURGERY WITH YOUR REGULAR TOOTHPASTE   Please read over the following fact sheets that you were given.    If you received a COVID test during your pre-op visit  it is requested that you wear a mask when out in public, stay away from anyone that may not be feeling well and notify your surgeon if you develop symptoms. If you have been in contact with anyone  that has tested positive in the last 10 days please notify you surgeon.

## 2022-05-03 ENCOUNTER — Other Ambulatory Visit: Payer: Self-pay

## 2022-05-03 ENCOUNTER — Inpatient Hospital Stay (HOSPITAL_COMMUNITY): Payer: Medicare PPO

## 2022-05-03 ENCOUNTER — Inpatient Hospital Stay (HOSPITAL_COMMUNITY): Payer: Medicare PPO | Admitting: Certified Registered"

## 2022-05-03 ENCOUNTER — Inpatient Hospital Stay (HOSPITAL_COMMUNITY)
Admission: RE | Disposition: A | Discharge status: Home / Self Care | Payer: Self-pay | Source: Home / Self Care | Attending: Neurosurgery

## 2022-05-03 ENCOUNTER — Inpatient Hospital Stay (HOSPITAL_COMMUNITY)
Admission: RE | Admit: 2022-05-03 | Discharge: 2022-05-04 | DRG: 455 | Disposition: A | Discharge status: Home / Self Care | Payer: Medicare PPO | Attending: Neurosurgery | Admitting: Neurosurgery

## 2022-05-03 ENCOUNTER — Encounter (HOSPITAL_COMMUNITY): Payer: Self-pay | Admitting: Neurosurgery

## 2022-05-03 DIAGNOSIS — M5416 Radiculopathy, lumbar region: Secondary | ICD-10-CM | POA: Diagnosis not present

## 2022-05-03 DIAGNOSIS — M4316 Spondylolisthesis, lumbar region: Principal | ICD-10-CM | POA: Diagnosis present

## 2022-05-03 DIAGNOSIS — Z9842 Cataract extraction status, left eye: Secondary | ICD-10-CM

## 2022-05-03 DIAGNOSIS — Z961 Presence of intraocular lens: Secondary | ICD-10-CM | POA: Diagnosis present

## 2022-05-03 DIAGNOSIS — M5417 Radiculopathy, lumbosacral region: Secondary | ICD-10-CM

## 2022-05-03 DIAGNOSIS — M4727 Other spondylosis with radiculopathy, lumbosacral region: Secondary | ICD-10-CM | POA: Diagnosis present

## 2022-05-03 DIAGNOSIS — M48062 Spinal stenosis, lumbar region with neurogenic claudication: Secondary | ICD-10-CM

## 2022-05-03 DIAGNOSIS — Z9841 Cataract extraction status, right eye: Secondary | ICD-10-CM

## 2022-05-03 DIAGNOSIS — Z7983 Long term (current) use of bisphosphonates: Secondary | ICD-10-CM

## 2022-05-03 DIAGNOSIS — Z881 Allergy status to other antibiotic agents status: Secondary | ICD-10-CM

## 2022-05-03 DIAGNOSIS — M4807 Spinal stenosis, lumbosacral region: Secondary | ICD-10-CM | POA: Diagnosis present

## 2022-05-03 DIAGNOSIS — B369 Superficial mycosis, unspecified: Secondary | ICD-10-CM | POA: Diagnosis not present

## 2022-05-03 DIAGNOSIS — N904 Leukoplakia of vulva: Secondary | ICD-10-CM | POA: Diagnosis not present

## 2022-05-03 DIAGNOSIS — L292 Pruritus vulvae: Secondary | ICD-10-CM | POA: Diagnosis not present

## 2022-05-03 DIAGNOSIS — M4317 Spondylolisthesis, lumbosacral region: Secondary | ICD-10-CM | POA: Diagnosis not present

## 2022-05-03 DIAGNOSIS — Z820 Family history of epilepsy and other diseases of the nervous system: Secondary | ICD-10-CM

## 2022-05-03 DIAGNOSIS — Z79899 Other long term (current) drug therapy: Secondary | ICD-10-CM

## 2022-05-03 DIAGNOSIS — M4726 Other spondylosis with radiculopathy, lumbar region: Secondary | ICD-10-CM | POA: Diagnosis present

## 2022-05-03 DIAGNOSIS — Z981 Arthrodesis status: Secondary | ICD-10-CM | POA: Diagnosis not present

## 2022-05-03 DIAGNOSIS — Z87891 Personal history of nicotine dependence: Secondary | ICD-10-CM | POA: Diagnosis not present

## 2022-05-03 DIAGNOSIS — Z4789 Encounter for other orthopedic aftercare: Secondary | ICD-10-CM | POA: Diagnosis not present

## 2022-05-03 LAB — ABO/RH: ABO/RH(D): A POS

## 2022-05-03 SURGERY — POSTERIOR LUMBAR FUSION 3 LEVEL
Anesthesia: General

## 2022-05-03 MED ORDER — PHENYLEPHRINE 80 MCG/ML (10ML) SYRINGE FOR IV PUSH (FOR BLOOD PRESSURE SUPPORT)
PREFILLED_SYRINGE | INTRAVENOUS | Status: AC
  Filled 2022-05-03: qty 10

## 2022-05-03 MED ORDER — FENTANYL CITRATE (PF) 100 MCG/2ML IJ SOLN
25.0000 ug | INTRAMUSCULAR | Status: DC | PRN
  Administered 2022-05-03 (×2): 25 ug via INTRAVENOUS

## 2022-05-03 MED ORDER — CEFAZOLIN SODIUM-DEXTROSE 2-4 GM/100ML-% IV SOLN
2.0000 g | Freq: Three times a day (TID) | INTRAVENOUS | Status: AC
  Administered 2022-05-03 – 2022-05-04 (×2): 2 g via INTRAVENOUS
  Filled 2022-05-03 (×2): qty 100

## 2022-05-03 MED ORDER — DEXAMETHASONE SODIUM PHOSPHATE 10 MG/ML IJ SOLN
INTRAMUSCULAR | Status: DC | PRN
  Administered 2022-05-03: 10 mg via INTRAVENOUS

## 2022-05-03 MED ORDER — LIDOCAINE 2% (20 MG/ML) 5 ML SYRINGE
INTRAMUSCULAR | Status: AC
  Filled 2022-05-03: qty 5

## 2022-05-03 MED ORDER — LIDOCAINE 2% (20 MG/ML) 5 ML SYRINGE
INTRAMUSCULAR | Status: DC | PRN
  Administered 2022-05-03: 40 mg via INTRAVENOUS

## 2022-05-03 MED ORDER — ROCURONIUM BROMIDE 10 MG/ML (PF) SYRINGE
PREFILLED_SYRINGE | INTRAVENOUS | Status: DC | PRN
  Administered 2022-05-03 (×2): 20 mg via INTRAVENOUS
  Administered 2022-05-03: 40 mg via INTRAVENOUS
  Administered 2022-05-03: 30 mg via INTRAVENOUS
  Administered 2022-05-03: 20 mg via INTRAVENOUS

## 2022-05-03 MED ORDER — FENTANYL CITRATE (PF) 250 MCG/5ML IJ SOLN
INTRAMUSCULAR | Status: DC | PRN
  Administered 2022-05-03: 100 ug via INTRAVENOUS
  Administered 2022-05-03 (×2): 25 ug via INTRAVENOUS
  Administered 2022-05-03: 50 ug via INTRAVENOUS
  Administered 2022-05-03 (×2): 25 ug via INTRAVENOUS

## 2022-05-03 MED ORDER — BACITRACIN ZINC 500 UNIT/GM EX OINT
TOPICAL_OINTMENT | CUTANEOUS | Status: AC
  Filled 2022-05-03: qty 28.35

## 2022-05-03 MED ORDER — PROPOFOL 10 MG/ML IV BOLUS
INTRAVENOUS | Status: AC
  Filled 2022-05-03: qty 20

## 2022-05-03 MED ORDER — KETAMINE HCL 10 MG/ML IJ SOLN
INTRAMUSCULAR | Status: DC | PRN
  Administered 2022-05-03: 30 mg via INTRAVENOUS

## 2022-05-03 MED ORDER — SODIUM CHLORIDE 0.9 % IV SOLN
250.0000 mL | INTRAVENOUS | Status: DC
  Administered 2022-05-03: 250 mL via INTRAVENOUS

## 2022-05-03 MED ORDER — 0.9 % SODIUM CHLORIDE (POUR BTL) OPTIME
TOPICAL | Status: DC | PRN
  Administered 2022-05-03: 1000 mL

## 2022-05-03 MED ORDER — SODIUM CHLORIDE 0.9% FLUSH: 3.0000 mL | INTRAVENOUS | Status: DC | PRN

## 2022-05-03 MED ORDER — ALBUMIN HUMAN 5 % IV SOLN: INTRAVENOUS | Status: DC | PRN

## 2022-05-03 MED ORDER — THROMBIN 5000 UNITS EX SOLR
CUTANEOUS | Status: AC
  Filled 2022-05-03: qty 5000

## 2022-05-03 MED ORDER — BUPIVACAINE-EPINEPHRINE (PF) 0.5% -1:200000 IJ SOLN
INTRAMUSCULAR | Status: DC | PRN
  Administered 2022-05-03: 10 mL

## 2022-05-03 MED ORDER — ROCURONIUM BROMIDE 10 MG/ML (PF) SYRINGE
PREFILLED_SYRINGE | INTRAVENOUS | Status: AC
  Filled 2022-05-03: qty 10

## 2022-05-03 MED ORDER — EPHEDRINE SULFATE-NACL 50-0.9 MG/10ML-% IV SOSY
PREFILLED_SYRINGE | INTRAVENOUS | Status: DC | PRN
  Administered 2022-05-03: 5 mg via INTRAVENOUS

## 2022-05-03 MED ORDER — SODIUM CHLORIDE 0.9% FLUSH
3.0000 mL | Freq: Two times a day (BID) | INTRAVENOUS | Status: DC
  Administered 2022-05-03: 3 mL via INTRAVENOUS

## 2022-05-03 MED ORDER — BUPIVACAINE LIPOSOME 1.3 % IJ SUSP
INTRAMUSCULAR | Status: DC | PRN
  Administered 2022-05-03: 20 mL

## 2022-05-03 MED ORDER — GLYCOPYRROLATE PF 0.2 MG/ML IJ SOSY
PREFILLED_SYRINGE | INTRAMUSCULAR | Status: AC
  Filled 2022-05-03: qty 1

## 2022-05-03 MED ORDER — CHLORHEXIDINE GLUCONATE CLOTH 2 % EX PADS: 6.0000 | MEDICATED_PAD | Freq: Once | CUTANEOUS | Status: DC

## 2022-05-03 MED ORDER — PROPOFOL 10 MG/ML IV BOLUS
INTRAVENOUS | Status: DC | PRN
  Administered 2022-05-03: 100 mg via INTRAVENOUS

## 2022-05-03 MED ORDER — SUGAMMADEX SODIUM 200 MG/2ML IV SOLN
INTRAVENOUS | Status: DC | PRN
  Administered 2022-05-03: 130 mg via INTRAVENOUS

## 2022-05-03 MED ORDER — MORPHINE SULFATE (PF) 4 MG/ML IV SOLN
4.0000 mg | INTRAVENOUS | Status: DC | PRN
  Administered 2022-05-03: 4 mg via INTRAVENOUS
  Filled 2022-05-03: qty 1

## 2022-05-03 MED ORDER — ATORVASTATIN CALCIUM 40 MG PO TABS
40.0000 mg | ORAL_TABLET | Freq: Every evening | ORAL | Status: DC
  Administered 2022-05-03: 40 mg via ORAL
  Filled 2022-05-03: qty 1

## 2022-05-03 MED ORDER — ONDANSETRON HCL 4 MG PO TABS: 4.0000 mg | ORAL_TABLET | Freq: Four times a day (QID) | ORAL | Status: DC | PRN

## 2022-05-03 MED ORDER — ZOLPIDEM TARTRATE 5 MG PO TABS: 5.0000 mg | ORAL_TABLET | Freq: Every evening | ORAL | Status: DC | PRN

## 2022-05-03 MED ORDER — BISACODYL 10 MG RE SUPP: 10.0000 mg | Freq: Every day | RECTAL | Status: DC | PRN

## 2022-05-03 MED ORDER — CEFAZOLIN SODIUM 1 G IJ SOLR
INTRAMUSCULAR | Status: AC
  Filled 2022-05-03: qty 20

## 2022-05-03 MED ORDER — CHLORHEXIDINE GLUCONATE 0.12 % MT SOLN
15.0000 mL | Freq: Once | OROMUCOSAL | Status: AC
  Administered 2022-05-03: 15 mL via OROMUCOSAL
  Filled 2022-05-03: qty 15

## 2022-05-03 MED ORDER — MENTHOL 3 MG MT LOZG: 1.0000 | LOZENGE | OROMUCOSAL | Status: DC | PRN

## 2022-05-03 MED ORDER — PHENYLEPHRINE HCL-NACL 20-0.9 MG/250ML-% IV SOLN
INTRAVENOUS | Status: DC | PRN
  Administered 2022-05-03: 20 ug/min via INTRAVENOUS

## 2022-05-03 MED ORDER — ORAL CARE MOUTH RINSE: 15.0000 mL | Freq: Once | OROMUCOSAL | Status: AC

## 2022-05-03 MED ORDER — BUPIVACAINE-EPINEPHRINE (PF) 0.5% -1:200000 IJ SOLN
INTRAMUSCULAR | Status: AC
  Filled 2022-05-03: qty 30

## 2022-05-03 MED ORDER — THROMBIN 5000 UNITS EX SOLR
OROMUCOSAL | Status: DC | PRN
  Administered 2022-05-03 (×3): 5 mL via TOPICAL

## 2022-05-03 MED ORDER — ONDANSETRON HCL 4 MG/2ML IJ SOLN
4.0000 mg | Freq: Four times a day (QID) | INTRAMUSCULAR | Status: DC | PRN
  Administered 2022-05-03: 4 mg via INTRAVENOUS
  Filled 2022-05-03: qty 2

## 2022-05-03 MED ORDER — OXYCODONE HCL 5 MG PO TABS: 5.0000 mg | ORAL_TABLET | ORAL | Status: DC | PRN

## 2022-05-03 MED ORDER — ACETAMINOPHEN 650 MG RE SUPP: 650.0000 mg | RECTAL | Status: DC | PRN

## 2022-05-03 MED ORDER — ACETAMINOPHEN 500 MG PO TABS
1000.0000 mg | ORAL_TABLET | Freq: Four times a day (QID) | ORAL | Status: AC
  Administered 2022-05-03 – 2022-05-04 (×4): 1000 mg via ORAL
  Filled 2022-05-03 (×4): qty 2

## 2022-05-03 MED ORDER — HYDROMORPHONE HCL 1 MG/ML IJ SOLN
INTRAMUSCULAR | Status: AC
  Filled 2022-05-03: qty 0.5

## 2022-05-03 MED ORDER — KETAMINE HCL 50 MG/5ML IJ SOSY
PREFILLED_SYRINGE | INTRAMUSCULAR | Status: AC
  Filled 2022-05-03: qty 5

## 2022-05-03 MED ORDER — CEFAZOLIN SODIUM 1 G IJ SOLR
INTRAMUSCULAR | Status: AC
  Filled 2022-05-03: qty 10

## 2022-05-03 MED ORDER — ACETAMINOPHEN 10 MG/ML IV SOLN
INTRAVENOUS | Status: AC
  Filled 2022-05-03: qty 100

## 2022-05-03 MED ORDER — OXYCODONE HCL 5 MG PO TABS
10.0000 mg | ORAL_TABLET | ORAL | Status: DC | PRN
  Administered 2022-05-03 – 2022-05-04 (×5): 10 mg via ORAL
  Filled 2022-05-03 (×5): qty 2

## 2022-05-03 MED ORDER — BUPIVACAINE LIPOSOME 1.3 % IJ SUSP
INTRAMUSCULAR | Status: AC
  Filled 2022-05-03: qty 20

## 2022-05-03 MED ORDER — ONDANSETRON HCL 4 MG/2ML IJ SOLN
INTRAMUSCULAR | Status: DC | PRN
  Administered 2022-05-03: 4 mg via INTRAVENOUS

## 2022-05-03 MED ORDER — ONDANSETRON HCL 4 MG/2ML IJ SOLN
INTRAMUSCULAR | Status: AC
  Filled 2022-05-03: qty 2

## 2022-05-03 MED ORDER — VANCOMYCIN HCL IN DEXTROSE 1-5 GM/200ML-% IV SOLN
1000.0000 mg | INTRAVENOUS | Status: AC
  Administered 2022-05-03: 1000 mg via INTRAVENOUS
  Filled 2022-05-03: qty 200

## 2022-05-03 MED ORDER — SODIUM CHLORIDE 0.9 % IR SOLN
Status: DC | PRN
  Administered 2022-05-03: 1000 mL

## 2022-05-03 MED ORDER — SURGIRINSE WOUND IRRIGATION SYSTEM - OPTIME
TOPICAL | Status: DC | PRN
  Administered 2022-05-03: 450 mL via TOPICAL

## 2022-05-03 MED ORDER — DOCUSATE SODIUM 100 MG PO CAPS
100.0000 mg | ORAL_CAPSULE | Freq: Two times a day (BID) | ORAL | Status: DC
  Administered 2022-05-03 – 2022-05-04 (×2): 100 mg via ORAL
  Filled 2022-05-03 (×2): qty 1

## 2022-05-03 MED ORDER — EPHEDRINE 5 MG/ML INJ
INTRAVENOUS | Status: AC
  Filled 2022-05-03: qty 5

## 2022-05-03 MED ORDER — FENTANYL CITRATE (PF) 250 MCG/5ML IJ SOLN
INTRAMUSCULAR | Status: AC
  Filled 2022-05-03: qty 5

## 2022-05-03 MED ORDER — AMISULPRIDE (ANTIEMETIC) 5 MG/2ML IV SOLN: 10.0000 mg | Freq: Once | INTRAVENOUS | Status: DC | PRN

## 2022-05-03 MED ORDER — GLYCOPYRROLATE 0.2 MG/ML IJ SOLN
INTRAMUSCULAR | Status: DC | PRN
  Administered 2022-05-03: .2 mg via INTRAVENOUS

## 2022-05-03 MED ORDER — PHENOL 1.4 % MT LIQD: 1.0000 | OROMUCOSAL | Status: DC | PRN

## 2022-05-03 MED ORDER — CEFAZOLIN SODIUM-DEXTROSE 2-3 GM-%(50ML) IV SOLR
INTRAVENOUS | Status: DC | PRN
  Administered 2022-05-03 (×2): 2 g via INTRAVENOUS

## 2022-05-03 MED ORDER — HYDROXYZINE HCL 50 MG/ML IM SOLN: 50.0000 mg | Freq: Four times a day (QID) | INTRAMUSCULAR | Status: DC | PRN

## 2022-05-03 MED ORDER — BACITRACIN ZINC 500 UNIT/GM EX OINT
TOPICAL_OINTMENT | CUTANEOUS | Status: DC | PRN
  Administered 2022-05-03: 1 via TOPICAL

## 2022-05-03 MED ORDER — LACTATED RINGERS IV SOLN: INTRAVENOUS | Status: DC

## 2022-05-03 MED ORDER — FENTANYL CITRATE (PF) 100 MCG/2ML IJ SOLN
INTRAMUSCULAR | Status: AC
  Filled 2022-05-03: qty 2

## 2022-05-03 MED ORDER — CYCLOBENZAPRINE HCL 10 MG PO TABS
10.0000 mg | ORAL_TABLET | Freq: Three times a day (TID) | ORAL | Status: DC | PRN
  Administered 2022-05-03 – 2022-05-04 (×2): 10 mg via ORAL
  Filled 2022-05-03 (×2): qty 1

## 2022-05-03 MED ORDER — ACETAMINOPHEN 500 MG PO TABS
1000.0000 mg | ORAL_TABLET | Freq: Once | ORAL | Status: AC
  Administered 2022-05-03: 1000 mg via ORAL
  Filled 2022-05-03: qty 2

## 2022-05-03 MED ORDER — PHENYLEPHRINE 80 MCG/ML (10ML) SYRINGE FOR IV PUSH (FOR BLOOD PRESSURE SUPPORT)
PREFILLED_SYRINGE | INTRAVENOUS | Status: DC | PRN
  Administered 2022-05-03 (×2): 80 ug via INTRAVENOUS
  Administered 2022-05-03: 160 ug via INTRAVENOUS
  Administered 2022-05-03: 80 ug via INTRAVENOUS
  Administered 2022-05-03: 160 ug via INTRAVENOUS

## 2022-05-03 MED ORDER — DEXAMETHASONE SODIUM PHOSPHATE 10 MG/ML IJ SOLN
INTRAMUSCULAR | Status: AC
  Filled 2022-05-03: qty 1

## 2022-05-03 MED ORDER — ACETAMINOPHEN 325 MG PO TABS: 650.0000 mg | ORAL_TABLET | ORAL | Status: DC | PRN

## 2022-05-03 SURGICAL SUPPLY — 74 items
APL SKNCLS STERI-STRIP NONHPOA (GAUZE/BANDAGES/DRESSINGS) ×1
BAG COUNTER SPONGE SURGICOUNT (BAG) ×1 IMPLANT
BAG SPNG CNTER NS LX DISP (BAG) ×2
BASKET BONE COLLECTION (BASKET) ×1 IMPLANT
BENZOIN TINCTURE PRP APPL 2/3 (GAUZE/BANDAGES/DRESSINGS) ×1 IMPLANT
BLADE CLIPPER SURG (BLADE) IMPLANT
BUR MATCHSTICK NEURO 3.0 LAGG (BURR) ×1 IMPLANT
BUR PRECISION FLUTE 6.0 (BURR) ×1 IMPLANT
CAGE ALTERA 10X31X9-13 15D (Cage) IMPLANT
CANISTER SUCT 3000ML PPV (MISCELLANEOUS) ×1 IMPLANT
CAP LOCK DLX THRD (Cap) IMPLANT
CNTNR URN SCR LID CUP LEK RST (MISCELLANEOUS) ×1 IMPLANT
CONT SPEC 4OZ STRL OR WHT (MISCELLANEOUS) ×1
COVER BACK TABLE 60X90IN (DRAPES) ×1 IMPLANT
DRAPE C-ARM 42X72 X-RAY (DRAPES) ×2 IMPLANT
DRAPE HALF SHEET 40X57 (DRAPES) ×1 IMPLANT
DRAPE LAPAROTOMY 100X72X124 (DRAPES) ×1 IMPLANT
DRAPE SURG 17X23 STRL (DRAPES) ×4 IMPLANT
DRSG OPSITE POSTOP 4X6 (GAUZE/BANDAGES/DRESSINGS) ×1 IMPLANT
DRSG OPSITE POSTOP 4X8 (GAUZE/BANDAGES/DRESSINGS) IMPLANT
ELECT BLADE 4.0 EZ CLEAN MEGAD (MISCELLANEOUS) ×1
ELECT REM PT RETURN 9FT ADLT (ELECTROSURGICAL) ×1
ELECTRODE BLDE 4.0 EZ CLN MEGD (MISCELLANEOUS) ×1 IMPLANT
ELECTRODE REM PT RTRN 9FT ADLT (ELECTROSURGICAL) ×1 IMPLANT
GAUZE 4X4 16PLY ~~LOC~~+RFID DBL (SPONGE) ×1 IMPLANT
GLOVE BIO SURGEON STRL SZ 6 (GLOVE) ×1 IMPLANT
GLOVE BIO SURGEON STRL SZ8 (GLOVE) ×2 IMPLANT
GLOVE BIO SURGEON STRL SZ8.5 (GLOVE) ×2 IMPLANT
GLOVE BIOGEL PI IND STRL 6.5 (GLOVE) ×1 IMPLANT
GLOVE EXAM NITRILE XL STR (GLOVE) IMPLANT
GOWN STRL REUS W/ TWL LRG LVL3 (GOWN DISPOSABLE) ×1 IMPLANT
GOWN STRL REUS W/ TWL XL LVL3 (GOWN DISPOSABLE) ×2 IMPLANT
GOWN STRL REUS W/TWL 2XL LVL3 (GOWN DISPOSABLE) IMPLANT
GOWN STRL REUS W/TWL LRG LVL3 (GOWN DISPOSABLE) ×1
GOWN STRL REUS W/TWL XL LVL3 (GOWN DISPOSABLE) ×2
HEMOSTAT POWDER KIT SURGIFOAM (HEMOSTASIS) ×1 IMPLANT
HEMOSTAT SURGICEL 2X3 (HEMOSTASIS) IMPLANT
IV NS 1000ML (IV SOLUTION) ×1
IV NS 1000ML BAXH (IV SOLUTION) IMPLANT
KIT BASIN OR (CUSTOM PROCEDURE TRAY) ×1 IMPLANT
KIT GRAFTMAG DEL NEURO DISP (NEUROSURGERY SUPPLIES) IMPLANT
KIT POSITION SURG JACKSON T1 (MISCELLANEOUS) ×1 IMPLANT
KIT TURNOVER KIT B (KITS) ×1 IMPLANT
NDL HYPO 21X1.5 SAFETY (NEEDLE) IMPLANT
NEEDLE HYPO 21X1.5 SAFETY (NEEDLE) IMPLANT
NEEDLE HYPO 22GX1.5 SAFETY (NEEDLE) ×1 IMPLANT
NS IRRIG 1000ML POUR BTL (IV SOLUTION) ×1 IMPLANT
PACK LAMINECTOMY NEURO (CUSTOM PROCEDURE TRAY) ×1 IMPLANT
PAD ARMBOARD 7.5X6 YLW CONV (MISCELLANEOUS) ×3 IMPLANT
PATTIES SURGICAL .5 X.5 (GAUZE/BANDAGES/DRESSINGS) IMPLANT
PATTIES SURGICAL .5 X1 (DISPOSABLE) IMPLANT
PATTIES SURGICAL 1X1 (DISPOSABLE) IMPLANT
PUTTY DBM 10CC CALC GRAN (Putty) IMPLANT
PUTTY DBM 5CC CALC GRAN (Putty) IMPLANT
ROD CURVED TI 6.35X95 (Rod) IMPLANT
SCREW PA DLX CREO 7.5X45 (Screw) IMPLANT
SCREW PA DLX CREO 7.5X50 (Screw) IMPLANT
SEALER BIPOLAR AQUA 2.3 (INSTRUMENTS) IMPLANT
SOL ELECTROSURG ANTI STICK (MISCELLANEOUS) ×1
SOLUTION ELECTROSURG ANTI STCK (MISCELLANEOUS) ×1 IMPLANT
SOLUTION IRRIG SURGIPHOR (IV SOLUTION) IMPLANT
SPIKE FLUID TRANSFER (MISCELLANEOUS) ×1 IMPLANT
SPONGE NEURO XRAY DETECT 1X3 (DISPOSABLE) IMPLANT
SPONGE SURGIFOAM ABS GEL 100 (HEMOSTASIS) IMPLANT
SPONGE T-LAP 4X18 ~~LOC~~+RFID (SPONGE) IMPLANT
STRIP CLOSURE SKIN 1/2X4 (GAUZE/BANDAGES/DRESSINGS) ×1 IMPLANT
SUT VIC AB 1 CT1 18XBRD ANBCTR (SUTURE) ×2 IMPLANT
SUT VIC AB 1 CT1 8-18 (SUTURE) ×2
SUT VIC AB 2-0 CP2 18 (SUTURE) ×2 IMPLANT
SYR 20ML LL LF (SYRINGE) IMPLANT
TOWEL GREEN STERILE (TOWEL DISPOSABLE) ×1 IMPLANT
TOWEL GREEN STERILE FF (TOWEL DISPOSABLE) ×1 IMPLANT
TRAY FOLEY MTR SLVR 16FR STAT (SET/KITS/TRAYS/PACK) ×1 IMPLANT
WATER STERILE IRR 1000ML POUR (IV SOLUTION) ×1 IMPLANT

## 2022-05-03 NOTE — Anesthesia Procedure Notes (Signed)
Procedure Name: Intubation Date/Time: 05/03/2022 8:52 AM  Performed by: Alwyn Ren, CRNAPre-anesthesia Checklist: Patient identified, Emergency Drugs available, Suction available and Patient being monitored Patient Re-evaluated:Patient Re-evaluated prior to induction Oxygen Delivery Method: Circle system utilized Preoxygenation: Pre-oxygenation with 100% oxygen Induction Type: IV induction Ventilation: Mask ventilation without difficulty Laryngoscope Size: Glidescope and 3 Grade View: Grade I Tube type: Oral Tube size: 7.0 mm Number of attempts: 1 Airway Equipment and Method: Oral airway and Rigid stylet Placement Confirmation: ETT inserted through vocal cords under direct vision, positive ETCO2 and breath sounds checked- equal and bilateral Secured at: 22 cm Tube secured with: Tape Dental Injury: Teeth and Oropharynx as per pre-operative assessment

## 2022-05-03 NOTE — Op Note (Signed)
Brief history: The patient is a 77 year old white female who is complaining of back and right greater left leg pain consistent with neurogenic claudication/lumbar radiculopathy.  She has failed medical management and was worked up with a lumbar MRI lumbar x-rays which demonstrate lumbar and lumbosacral spondylolisthesis and spinal stenosis.  I discussed the various treatment options with her including surgery.  He has decided proceed with surgery.  Preoperative diagnosis: Lumbar and lumbosacral spondylolisthesis, facet arthropathy, spinal stenosis compressing L4, L5 and S1 nerve roots; lumbago; lumbar radiculopathy; neurogenic claudication  Postoperative diagnosis: The same  Procedure: Bilateral L3-4, L4-5 and L5-S1 laminotomy/foraminotomies/medial facetectomy to decompress the bilateral L4, L5 and S1 nerve roots(the work required to do this was in addition to the work required to do the posterior lumbar interbody fusion because of the patient's spinal stenosis, facet arthropathy. Etc. requiring a wide decompression of the nerve roots.);  Right L3-4, L4-5 and L5-S1 transforaminal lumbar interbody fusion with local morselized autograft bone and Zimmer DBM; insertion of interbody prosthesis at L3-4, L4-5 and L5-S1 (globus peek expandable interbody prosthesis); posterior segmental instrumentation from L3 to S1 with globus titanium pedicle screws and rods; posterior lateral arthrodesis at L3-4, L4-5 and L5-S1 with local morselized autograft bone and Zimmer DBM.  Surgeon: Dr. Delma Officer  Asst.: Hildred Priest, NP  Anesthesia: Gen. endotracheal  Estimated blood loss: 250 cc  Drains: None  Complications: None  Description of procedure: The patient was brought to the operating room by the anesthesia team. General endotracheal anesthesia was induced. The patient was turned to the prone position on the Wilson frame. The patient's lumbosacral region was then prepared with Betadine scrub and Betadine  solution. Sterile drapes were applied.  I then injected the area to be incised with Marcaine with epinephrine solution. I then used the scalpel to make a linear midline incision over the L3-4, L4-5 and L5-S1 interspace. I then used electrocautery to perform a bilateral subperiosteal dissection exposing the spinous process and lamina of L3-4, L4-5 and L5-S1. We then obtained intraoperative radiograph to confirm our location. We then inserted the Verstrac retractor to provide exposure.  I began the decompression by using the high speed drill to perform laminotomies at L3-4, L4-5 and L5-S1 bilaterally. We then used the Kerrison punches to widen the laminotomy and removed the ligamentum flavum at L3-4, L4-5 and L5-S1 bilaterally. We used the Kerrison punches to remove the medial facets at L3-4, L4-5 and L5-S1 bilaterally, we removed the right L3-4, L4-5 and L5-S1 facet. We performed wide foraminotomies about the bilateral L4, L5 and S1 nerve roots completing the decompression.  We now turned our attention to the posterior lumbar interbody fusion. I used a scalpel to incise the intervertebral disc at L3-4, L4-5 and L5-S1 bilaterally. I then performed a partial intervertebral discectomy at L3-4, L4-5 and L5-S1 bilaterally using the pituitary forceps. We prepared the vertebral endplates at L3-4, L4-5 and L5-S1 bilaterally for the fusion by removing the soft tissues with the curettes. We then used the trial spacers to pick the appropriate sized interbody prosthesis. We prefilled his prosthesis with a combination of local morselized autograft bone that we obtained during the decompression as well as Zimmer DBM. We inserted the prefilled prosthesis into the interspace at L3-4, L4-5 and L5-S1 from the right, we then turned and expanded the prosthesis. There was a good snug fit of the prosthesis in the interspace. We then filled and the remainder of the intervertebral disc space with local morselized autograft bone and  Zimmer DBM.  This completed the posterior lumbar interbody arthrodesis.  During the decompression and insertion of the prosthesis the assistant protected the thecal sac and nerve roots with the D'Errico retractor.  We now turned attention to the instrumentation. Under fluoroscopic guidance we cannulated the bilateral L3, L4, L5 and S1 pedicles with the bone probe. We then removed the bone probe. We then tapped the pedicle with a 6.5 millimeter tap. We then removed the tap. We probed inside the tapped pedicle with a ball probe to rule out cortical breaches. We then inserted a 7.5 x 45 and 50 millimeter pedicle screw into the L3, L4, L5 and S1 pedicles bilaterally under fluoroscopic guidance. We then palpated along the medial aspect of the pedicles to rule out cortical breaches. There were none. The nerve roots were not injured. We then connected the unilateral pedicle screws with a lordotic rod. We compressed the construct and secured the rod in place with the caps. We then tightened the caps appropriately. This completed the instrumentation from L3-S1 bilaterally.  We now turned our attention to the posterior lateral arthrodesis at L3-4, L4-5 and L5-S1. We used the high-speed drill to decorticate the remainder of the facets, pars, transverse process at L3-4, L4-5 and L5-S1. We then applied a combination of local morselized autograft bone and Zimmer DBM over these decorticated posterior lateral structures. This completed the posterior lateral arthrodesis.  We then obtained hemostasis using bipolar electrocautery. We irrigated the wound out with Betadine solution. We inspected the thecal sac and nerve roots and noted they were well decompressed. We then removed the retractor.  We injected Exparel . We reapproximated patient's thoracolumbar fascia with interrupted #1 Vicryl suture. We reapproximated patient's subcutaneous tissue with interrupted 2-0 Vicryl suture. The reapproximated patient's skin with  Steri-Strips and benzoin. The wound was then coated with bacitracin ointment. A sterile dressing was applied. The drapes were removed. The patient was subsequently returned to the supine position where they were extubated by the anesthesia team. He was then transported to the post anesthesia care unit in stable condition. All sponge instrument and needle counts were reportedly correct at the end of this case.

## 2022-05-03 NOTE — Progress Notes (Signed)
Orthopedic Tech Progress Note Patient Details:  Casey Armstrong 11-11-1945 [id number]  Ortho Devices Type of Ortho Device: Lumbar corsett Ortho Device/Splint Location: BACK Ortho Device/Splint Interventions: Ordered, Other (comment)   Post Interventions Patient Tolerated: Well Instructions Provided: Care of device  [name] 05/03/2022, 3:40 PM

## 2022-05-03 NOTE — H&P (Signed)
Subjective: The patient is a 77 year old white female who was complained of back and right greater left leg pain consistent with neurogenic claudication.  She has failed medical management and was worked up with lumbar x-rays and lumbar MRI which demonstrated lumbar lumbosacral spinal thesis, spinal stenosis, etc.  I discussed the various treatment options with her.  She has decided proceed with surgery.  Past Medical History:  Diagnosis Date   Arthritis    Headache    Lichen sclerosus    Neuromuscular disorder    PONV (postoperative nausea and vomiting)     Past Surgical History:  Procedure Laterality Date   CATARACT EXTRACTION W/PHACO  08/15/2010   Procedure: CATARACT EXTRACTION PHACO AND INTRAOCULAR LENS PLACEMENT (IOC);  Surgeon: Gemma Payor;  Location: [facility];  Service: Ophthalmology;  Laterality: Left;  CDE 14.67   CATARACT EXTRACTION W/PHACO  08/25/2010   Procedure: CATARACT EXTRACTION PHACO AND INTRAOCULAR LENS PLACEMENT (IOC);  Surgeon: Gemma Payor;  Location: [facility];  Service: Ophthalmology;  Laterality: Right;  CDE 12.65   COLONOSCOPY  01/30/2011   Procedure: COLONOSCOPY;  Surgeon: Corbin Ade, MD;  Location: [facility];  Service: Endoscopy;  Laterality: N/A;  8:30 AM   DILATION AND CURETTAGE OF UTERUS     30+ yrs ago   EYE SURGERY     left KPE w/ IOL   TONSILLECTOMY     TUBAL LIGATION      Allergies  Allergen Reactions   Keflex [Cephalexin] Other (See Comments)    Causes yeast infections    Social History   Tobacco Use   Smoking status: Former    Packs/day: 0.00    Years: 30.00    Additional pack years: 0.00    Total pack years: 0.00    Types: Cigarettes    Quit date: 08/11/2003    Years since quitting: 18.7   Smokeless tobacco: Never  Substance Use Topics   Alcohol use: Yes    Alcohol/week: 1.0 standard drink of alcohol    Types: 1 Glasses of wine per week    Comment: once in a blue moon    Family History  Problem Relation Age of Onset   Alzheimer's  disease Mother    Cancer Father    Suicidality Brother    Anesthesia problems Neg Hx    Hypotension Neg Hx    Malignant hyperthermia Neg Hx    Pseudochol deficiency Neg Hx    Prior to Admission medications   Medication Sig Start Date End Date Taking? Authorizing Provider  acetaminophen (TYLENOL) 500 MG tablet Take 500-1,000 mg by mouth 3 (three) times daily as needed (pain.).   Yes [provider]  alendronate (FOSAMAX) 70 MG tablet Take 70 mg by mouth every Sunday. Take with a full glass of water on an empty stomach.   Yes [provider]  atorvastatin (LIPITOR) 40 MG tablet Take 40 mg by mouth every evening.   Yes [provider]  carboxymethylcellul-glycerin (OPTIVE) 0.5-0.9 % ophthalmic solution Place 1 drop into both eyes 4 (four) times daily as needed (dry/irritated eyes.).   Yes [provider]  Cholecalciferol (VITAMIN D3 PO) Take 1,000 Units by mouth in the morning.   Yes [provider]  LORazepam (ATIVAN) 1 MG tablet Take 1 mg by mouth at bedtime.   Yes [provider]  MAGNESIUM PO Take 500 mg by mouth at bedtime.   Yes [provider]  meloxicam (MOBIC) 7.5 MG tablet TAKE 1 TABLET BY MOUTH EVERY DAY  AS NEEDED FOR PAIN 03/17/22  Yes Vivi Barrack, DPM  zinc sulfate 220 (50 Zn) MG capsule Take 220 mg by mouth in the morning.   Yes [provider]     Review of Systems  Positive ROS: As above  All other systems have been reviewed and were otherwise negative with the exception of those mentioned in the HPI and as above.  Objective: Vital signs in last 24 hours: Temp:  [98.5 F (36.9 C)] 98.5 F (36.9 C) (04/10 0722) Pulse Rate:  [66] 66 (04/10 0722) Resp:  [18] 18 (04/10 0722) BP: (152)/(66) 152/66 (04/10 0722) SpO2:  [99 %] 99 % (04/10 0722) Weight:  [63 kg] 63 kg (04/10 0722) Estimated body mass index is 24.62 kg/m as calculated from the following:   Height as of this encounter: 5\' 3"  (1.6  m).   Weight as of this encounter: 63 kg.   General Appearance: Alert Head: Normocephalic, without obvious abnormality, atraumatic Eyes: PERRL, conjunctiva/corneas clear, EOM's intact,    Ears: Normal  Throat: Normal  Neck: Supple, Back: unremarkable Lungs: Clear to auscultation bilaterally, respirations unlabored Heart: Regular rate and rhythm, no murmur, rub or gallop Abdomen: Soft, non-tender Extremities: Extremities normal, atraumatic, no cyanosis or edema Skin: unremarkable  NEUROLOGIC:   Mental status: alert and oriented,Motor Exam - grossly normal Sensory Exam - grossly normal Reflexes:  Coordination - grossly normal Gait - grossly normal Balance - grossly normal Cranial Nerves: I: smell Not tested  II: visual acuity  OS: Normal  OD: Normal   II: visual fields Full to confrontation  II: pupils Equal, round, reactive to light  III,VII: ptosis None  III,IV,VI: extraocular muscles  Full ROM  V: mastication Normal  V: facial light touch sensation  Normal  V,VII: corneal reflex  Present  VII: facial muscle function - upper  Normal  VII: facial muscle function - lower Normal  VIII: hearing Not tested  IX: soft palate elevation  Normal  IX,X: gag reflex Present  XI: trapezius strength  5/5  XI: sternocleidomastoid strength 5/5  XI: neck flexion strength  5/5  XII: tongue strength  Normal    Data Review Lab Results  Component Value Date   WBC 6.7 04/27/2022   HGB 12.5 04/27/2022   HCT 37.7 04/27/2022   MCV 93.5 04/27/2022   PLT 239 04/27/2022   Lab Results  Component Value Date   NA 136 08/11/2010   K 3.9 08/11/2010   CL 101 08/11/2010   CO2 26 08/11/2010   BUN 7 08/11/2010   CREATININE 0.59 08/11/2010   GLUCOSE 112 (H) 08/11/2010   No results found for: "INR", "PROTIME"  Assessment/Plan: Lumbar and lumbosacral spondylolisthesis, spinal stenosis, facet arthropathy, lumbar radiculopathy, neurogenic claudication, lumbago: I have discussed situation  with the patient.  I have reviewed her imaging studies with her and pointed out the abnormalities.  We have discussed the various treatment options including surgery.  I described the surgical treatment option of an L3-4, L4-5 and L5-S1 decompression, instrumentation and fusion.  I have shown her surgical models.  I have given her surgical pamphlet.  We have discussed the risk, benefits, alternatives, expected postop course, and likelihood of achieving our goals with surgery.  I answered all her questions.  She has decided proceed with surgery.   Cristi Loron 05/03/2022 8:33 AM

## 2022-05-03 NOTE — Anesthesia Postprocedure Evaluation (Signed)
Anesthesia Post Note  Patient: Casey Armstrong  Procedure(s) Performed: POSTERIOR LUMBAR INTERBODY FUSION, INTERBODY PROSTHESIS ,POSTERIOR INSTRUMENTATION LUMBAR THREE-FOUR, LUMBAR FOUR-FIVE, LUMBAR FIVE-SACRAL ONE     Patient location during evaluation: PACU Anesthesia Type: General Level of consciousness: awake and alert Pain management: pain level controlled Vital Signs Assessment: post-procedure vital signs reviewed and stable Respiratory status: spontaneous breathing, nonlabored ventilation, respiratory function stable and patient connected to nasal cannula oxygen Cardiovascular status: blood pressure returned to baseline and stable Postop Assessment: no apparent nausea or vomiting Anesthetic complications: no  No notable events documented.  Last Vitals:  Vitals:   05/03/22 1545 05/03/22 1612  BP: 128/64 126/61  Pulse: 77 81  Resp: 12 18  Temp: 36.5 C 36.5 C  SpO2: 97% 96%    Last Pain:  Vitals:   05/03/22 1612  TempSrc: Oral  PainSc:                  Selina Tapper,W. EDMOND

## 2022-05-03 NOTE — Anesthesia Preprocedure Evaluation (Signed)
Anesthesia Evaluation  Patient identified by MRN, date of birth, ID band Patient awake    Reviewed: Allergy & Precautions, NPO status , Patient's Chart, lab work & pertinent test results  Airway Mallampati: II  TM Distance: >3 FB Neck ROM: Full    Dental  (+) Dental Advisory Given   Pulmonary former smoker   breath sounds clear to auscultation       Cardiovascular negative cardio ROS  Rhythm:Regular Rate:Normal     Neuro/Psych  Neuromuscular disease    GI/Hepatic negative GI ROS, Neg liver ROS,,,  Endo/Other  negative endocrine ROS    Renal/GU negative Renal ROS     Musculoskeletal  (+) Arthritis ,    Abdominal   Peds  Hematology negative hematology ROS (+)   Anesthesia Other Findings   Reproductive/Obstetrics                              Lab Results  Component Value Date   WBC 6.7 04/27/2022   HGB 12.5 04/27/2022   HCT 37.7 04/27/2022   MCV 93.5 04/27/2022   PLT 239 04/27/2022   Lab Results  Component Value Date   CREATININE 0.59 08/11/2010   BUN 7 08/11/2010   NA 136 08/11/2010   K 3.9 08/11/2010   CL 101 08/11/2010   CO2 26 08/11/2010    Anesthesia Physical Anesthesia Plan  ASA: 2  Anesthesia Plan: General   Post-op Pain Management: Tylenol PO (pre-op)* and Ketamine IV*   Induction: Intravenous  PONV Risk Score and Plan: 3 and Dexamethasone, Ondansetron and Treatment may vary due to age or medical condition  Airway Management Planned: Oral ETT  Additional Equipment: Arterial line  Intra-op Plan:   Post-operative Plan: Extubation in OR  Informed Consent: I have reviewed the patients History and Physical, chart, labs and discussed the procedure including the risks, benefits and alternatives for the proposed anesthesia with the patient or authorized representative who has indicated his/her understanding and acceptance.     Dental advisory given  Plan  Discussed with: CRNA  Anesthesia Plan Comments:          Anesthesia Quick Evaluation

## 2022-05-03 NOTE — Transfer of Care (Signed)
Immediate Anesthesia Transfer of Care Note  Patient: Casey Armstrong  Procedure(s) Performed: POSTERIOR LUMBAR INTERBODY FUSION, INTERBODY PROSTHESIS ,POSTERIOR INSTRUMENTATION LUMBAR THREE-FOUR, LUMBAR FOUR-FIVE, LUMBAR FIVE-SACRAL ONE  Patient Location: PACU  Anesthesia Type:General  Level of Consciousness: awake, oriented, and patient cooperative  Airway & Oxygen Therapy: Patient Spontanous Breathing and Patient connected to face mask oxygen  Post-op Assessment: Report given to RN, Post -op Vital signs reviewed and stable, Patient moving all extremities, and Patient moving all extremities X 4  Post vital signs: Reviewed and stable  Last Vitals:  Vitals Value Taken Time  BP 138/66 05/03/22 1515  Temp    Pulse 84 05/03/22 1519  Resp 17 05/03/22 1519  SpO2 99 % 05/03/22 1519  Vitals shown include unvalidated device data.  Last Pain:  Vitals:   05/03/22 0747  TempSrc:   PainSc: 7          Complications: No notable events documented.

## 2022-05-04 MED ORDER — OXYCODONE-ACETAMINOPHEN 5-325 MG PO TABS: 1.0000 | ORAL_TABLET | ORAL | 0 refills | Status: AC | PRN

## 2022-05-04 MED ORDER — DOCUSATE SODIUM 100 MG PO CAPS: 100.0000 mg | ORAL_CAPSULE | Freq: Two times a day (BID) | ORAL | 0 refills | Status: AC

## 2022-05-04 MED ORDER — CYCLOBENZAPRINE HCL 5 MG PO TABS: 5.0000 mg | ORAL_TABLET | Freq: Three times a day (TID) | ORAL | 0 refills | Status: AC | PRN

## 2022-05-04 MED ORDER — CYCLOBENZAPRINE HCL 5 MG PO TABS: 5.0000 mg | ORAL_TABLET | Freq: Three times a day (TID) | ORAL | Status: DC | PRN

## 2022-05-04 MED ORDER — OXYCODONE-ACETAMINOPHEN 5-325 MG PO TABS: 1.0000 | ORAL_TABLET | ORAL | Status: DC | PRN

## 2022-05-04 MED FILL — Thrombin For Soln 5000 Unit: CUTANEOUS | Qty: 5000 | Status: AC

## 2022-05-04 NOTE — Evaluation (Signed)
Occupational Therapy Evaluation Patient Details Name: Casey Armstrong MRN: [id number] DOB: 12-Oct-1945 Today's Date: 05/04/2022   History of Present Illness 77 yo s/p PLIF L3 - S1. QBH:ALPFXT sclerosus, arthritis, neuromuscular disorder.   Clinical Impression   All education completed regarding back precautions for ADL and functional mobility for ADL using compensatory strategies, DME and AE PRN; back handout reviewed; dtr present for education. No further OT needed.      Recommendations for follow up therapy are one component of a multi-disciplinary discharge planning process, led by the attending physician.  Recommendations may be updated based on patient status, additional functional criteria and insurance authorization.   Assistance Recommended at Discharge Frequent or constant Supervision/Assistance (initially)  Patient can return home with the following A little help with walking and/or transfers;A little help with bathing/dressing/bathroom;Assistance with cooking/housework;Assist for transportation    Functional Status Assessment  Patient has had a recent decline in their functional status and demonstrates the ability to make significant improvements in function in a reasonable and predictable amount of time.  Equipment Recommendations  BSC/3in1;Other (comment) (RW)    Recommendations for Other Services       Precautions / Restrictions Precautions Precautions: Back;Fall Precaution Booklet Issued: Yes (comment) Required Braces or Orthoses: Spinal Brace Spinal Brace: Lumbar corset Restrictions Weight Bearing Restrictions: No      Mobility Bed Mobility Overal bed mobility: Needs Assistance Bed Mobility: Sidelying to Sit, Rolling Rolling: Min guard Sidelying to sit: Min guard       General bed mobility comments: pt anxious about bed mobility    Transfers Overall transfer level: Needs assistance Equipment used: Rolling walker (2 wheels) Transfers: Sit to/from  Stand Sit to Stand: Min guard                  Balance Overall balance assessment: Mild deficits observed, not formally tested                                         ADL either performed or assessed with clinical judgement   ADL Overall ADL's : Needs assistance/impaired Eating/Feeding: Independent   Grooming: Set up;Supervision/safety   Upper Body Bathing: Set up   Lower Body Bathing: Minimal assistance;Sit to/from stand   Upper Body Dressing : Set up;Supervision/safety   Lower Body Dressing: Minimal assistance;Sit to/from stand   Toilet Transfer: Supervision/safety   Toileting- Architect and Hygiene: Minimal assistance       Functional mobility during ADLs: Cueing for sequencing;Rolling walker (2 wheels);Min guard General ADL Comments: Educated on compensatory technqiues for ADL; difficulty with figure four positioning however able to do this at baseline; Pt able to return demonstrate during session - dtr present for education; educated on recommendation for use of 3in1 bedside at night to reduce risk of falls and then using 3in1 over toilet to increase independence with transfers; pt able to reach bottom to complete pericare without bending or twisting; recommend use of reacher as needed; educated on strategies to reduce risk of falls     Vision Baseline Vision/History: 0 No visual deficits;1 Wears glasses       Perception     Praxis      Pertinent Vitals/Pain Pain Assessment Pain Assessment: 0-10 Pain Score: 10-Worst pain ever Pain Location: BACK Pain Descriptors / Indicators: Aching, Discomfort, Operative site guarding Pain Intervention(s): Limited activity within patient's tolerance, Premedicated before session     Hand  Dominance Right   Extremity/Trunk Assessment Upper Extremity Assessment Upper Extremity Assessment: Overall WFL for tasks assessed   Lower Extremity Assessment Lower Extremity Assessment: Defer to PT  evaluation   Cervical / Trunk Assessment Cervical / Trunk Assessment: Back Surgery   Communication     Cognition Arousal/Alertness: Awake/alert Behavior During Therapy: Anxious Overall Cognitive Status: Within Functional Limits for tasks assessed                                 General Comments: repetitions required however feel due to anxiety; dtr states this is baseline     General Comments       Exercises     Shoulder Instructions      Home Living Family/patient expects to be discharged to:: Private residence Living Arrangements: Alone Available Help at Discharge: Family;Available 24 hours/day Type of Home: House Home Access: Stairs to enter [employer] of Steps: 2 Entrance Stairs-Rails: Right Home Layout: Two level;Able to live on main level with bedroom/bathroom     Bathroom Shower/Tub: Tub/shower unit;Walk-in shower   Bathroom Toilet: Standard Bathroom Accessibility: No   Home Equipment: Firefighter;Shower seat          Prior Functioning/Environment Prior Level of Function : Independent/Modified Independent                        OT Problem List: Decreased safety awareness;Decreased knowledge of use of DME or AE;Decreased knowledge of precautions;Pain      OT Treatment/Interventions:      OT Goals(Current goals can be found in the care plan section) Acute Rehab OT Goals Patient Stated Goal: home OT Goal Formulation: All assessment and education complete, DC therapy  OT Frequency:      Co-evaluation              AM-PAC OT "6 Clicks" Daily Activity     Outcome Measure Help from another person eating meals?: None Help from another person taking care of personal grooming?: A Little Help from another person toileting, which includes using toliet, bedpan, or urinal?: A Little Help from another person bathing (including washing, rinsing, drying)?: A Little Help from another person to put on and taking off  regular upper body clothing?: A Little Help from another person to put on and taking off regular lower body clothing?: A Little 6 Click Score: 19   End of Session Equipment Utilized During Treatment: Rolling walker (2 wheels);Back brace Nurse Communication: Mobility status (DME needs)  Activity Tolerance: Patient tolerated treatment well Patient left: in bed;with call bell/phone within reach;with family/visitor present;Other (comment) (workign with PT)  OT Visit Diagnosis: Unsteadiness on feet (R26.81);Pain Pain - part of body:  (back)                Time: 7035-0093 OT Time Calculation (min): 30 min Charges:  OT General Charges $OT Visit: 1 Visit OT Evaluation $OT Eval Low Complexity: 1 Low OT Treatments $Self Care/Home Management : 8-22 mins  Luisa Dago, OT/L   Acute OT Clinical Specialist Acute Rehabilitation Services Pager [phone] Office [phone]   [facility] 05/04/2022, 9:01 AM

## 2022-05-04 NOTE — Plan of Care (Signed)
  Problem: Education: Goal: Ability to verbalize activity precautions or restrictions will improve Outcome: Completed/Met Goal: Knowledge of the prescribed therapeutic regimen will improve Outcome: Completed/Met Goal: Understanding of discharge needs will improve Outcome: Completed/Met   Problem: Activity: Goal: Ability to avoid complications of mobility impairment will improve Outcome: Completed/Met Goal: Ability to tolerate increased activity will improve Outcome: Completed/Met Goal: Will remain free from falls Outcome: Completed/Met   Problem: Bowel/Gastric: Goal: Gastrointestinal status for postoperative course will improve Outcome: Completed/Met   Problem: Clinical Measurements: Goal: Ability to maintain clinical measurements within normal limits will improve Outcome: Completed/Met Goal: Postoperative complications will be avoided or minimized Outcome: Completed/Met Goal: Diagnostic test results will improve Outcome: Completed/Met   Problem: Pain Management: Goal: Pain level will decrease Outcome: Completed/Met   Problem: Skin Integrity: Goal: Will show signs of wound healing Outcome: Completed/Met   Problem: Health Behavior/Discharge Planning: Goal: Identification of resources available to assist in meeting health care needs will improve Outcome: Completed/Met   Problem: Bladder/Genitourinary: Goal: Urinary functional status for postoperative course will improve Outcome: Completed/Met Patient alert and oriented, void, ambulate, surgical site clean and dry. D/c instructions explain and given to the patient.

## 2022-05-04 NOTE — Evaluation (Signed)
Physical Therapy Evaluation and Discharge Patient Details Name: Casey Armstrong MRN: [id number] DOB: 1945-11-05 Today's Date: 05/04/2022  History of Present Illness  77 yo s/p PLIF L3 - S1. IOE:VOJJKK sclerosus, arthritis, neuromuscular disorder.  Clinical Impression  Patient evaluated by Physical Therapy with no further acute PT needs identified. Pt denies radicular pain into BLE's. Pt overall is mobilizing fairly well. Ambulating 400 ft with a RW at a supervision level. Negotiated 2 steps with railing; demonstrated increased independence with ascending/descending sideways with bilateral hand support vs unilateral hand support. Pt daughter present to observe technique. Education provided regarding brace use, car transfer technique, activity recommendations, spinal precautions. All education has been completed and the patient has no further questions. See below for any follow-up Physical Therapy or equipment needs. PT is signing off. Thank you for this referral.      Recommendations for follow up therapy are one component of a multi-disciplinary discharge planning process, led by the attending physician.  Recommendations may be updated based on patient status, additional functional criteria and insurance authorization.  Follow Up Recommendations       Assistance Recommended at Discharge PRN  Patient can return home with the following  A little help with walking and/or transfers;A little help with bathing/dressing/bathroom;Assistance with cooking/housework;Assist for transportation;Help with stairs or ramp for entrance    Equipment Recommendations Rolling walker (2 wheels);BSC/3in1  Recommendations for Other Services       Functional Status Assessment Patient has had a recent decline in their functional status and demonstrates the ability to make significant improvements in function in a reasonable and predictable amount of time.     Precautions / Restrictions Precautions Precautions:  Back;Fall Precaution Booklet Issued: Yes (comment) Required Braces or Orthoses: Spinal Brace Spinal Brace: Lumbar corset;Applied in sitting position Restrictions Weight Bearing Restrictions: No      Mobility  Bed Mobility Overal bed mobility: Needs Assistance Bed Mobility: Sit to Sidelying         Sit to sidelying: Min assist General bed mobility comments: Assist for LE back into bed    Transfers Overall transfer level: Needs assistance Equipment used: Rolling walker (2 wheels) Transfers: Sit to/from Stand Sit to Stand: Min guard                Ambulation/Gait Ambulation/Gait assistance: Supervision Gait Distance (Feet): 400 Feet Assistive device: Rolling walker (2 wheels) Gait Pattern/deviations: Step-through pattern, Decreased stride length       General Gait Details: Slow and steady pace. Cues for glute activation, walker proximity, scapular depression, upward gaze  Stairs Stairs: Yes Stairs assistance: Min guard, Min assist Stair Management: One rail Right, Forwards, Sideways Number of Stairs: 2 General stair comments: Ascending stairs forward with R rail and L HHA, requiring minA, progressed to min guard assist with bilateral hands on railing negotiating steps sideways  Wheelchair Mobility    Modified Rankin (Stroke Patients Only)       Balance Overall balance assessment: Mild deficits observed, not formally tested                                           Pertinent Vitals/Pain Pain Assessment Pain Assessment: Faces Faces Pain Scale: Hurts little more Pain Location: BACK Pain Descriptors / Indicators: Aching, Discomfort, Operative site guarding Pain Intervention(s): Limited activity within patient's tolerance, Monitored during session    Home Living Family/patient expects to be discharged to::  Private residence Living Arrangements: Alone Available Help at Discharge: Family;Available 24 hours/day Type of Home:  House Home Access: Stairs to enter Entrance Stairs-Rails: Right Entrance Stairs-Number of Steps: 2   Home Layout: Two level;Able to live on main level with bedroom/bathroom Home Equipment: Standard Walker;Shower seat      Prior Function Prior Level of Function : Independent/Modified Independent                     Hand Dominance   Dominant Hand: Right    Extremity/Trunk Assessment   Upper Extremity Assessment Upper Extremity Assessment: Overall WFL for tasks assessed    Lower Extremity Assessment Lower Extremity Assessment: RLE deficits/detail;LLE deficits/detail RLE Deficits / Details: Hip flexion 4/5, otherwise 5/5 strength LLE Deficits / Details: Hip flexion 4/5, otherwise 5/5 strength    Cervical / Trunk Assessment Cervical / Trunk Assessment: Back Surgery  Communication      Cognition Arousal/Alertness: Awake/alert Behavior During Therapy: Anxious Overall Cognitive Status: Within Functional Limits for tasks assessed                                 General Comments: repetitions required however feel due to anxiety; dtr states this is baseline        General Comments      Exercises     Assessment/Plan    PT Assessment Patient does not need any further PT services  PT Problem List         PT Treatment Interventions      PT Goals (Current goals can be found in the Care Plan section)  Acute Rehab PT Goals Patient Stated Goal: to heal PT Goal Formulation: With patient/family Time For Goal Achievement: 05/18/22 Potential to Achieve Goals: Good    Frequency       Co-evaluation               AM-PAC PT "6 Clicks" Mobility  Outcome Measure Help needed turning from your back to your side while in a flat bed without using bedrails?: None Help needed moving from lying on your back to sitting on the side of a flat bed without using bedrails?: A Little Help needed moving to and from a bed to a chair (including a wheelchair)?: A  Little Help needed standing up from a chair using your arms (e.g., wheelchair or bedside chair)?: A Little Help needed to walk in hospital room?: A Little Help needed climbing 3-5 steps with a railing? : A Little 6 Click Score: 19    End of Session Equipment Utilized During Treatment: Back brace Activity Tolerance: Patient tolerated treatment well Patient left: in bed;with call bell/phone within reach;with family/visitor present Nurse Communication: Mobility status PT Visit Diagnosis: Unsteadiness on feet (R26.81);Difficulty in walking, not elsewhere classified (R26.2);Pain Pain - part of body:  (back)    Time: 9518-8416 PT Time Calculation (min) (ACUTE ONLY): 24 min   Charges:   PT Evaluation $PT Eval Low Complexity: 1 Low PT Treatments $Gait Training: 8-22 mins        Lillia Pauls, PT, DPT Acute Rehabilitation Services Office [phone]   Norval Morton 05/04/2022, 9:13 AM

## 2022-05-04 NOTE — Discharge Summary (Signed)
Physician Discharge Summary  Patient ID: Casey Armstrong MRN: [id number] DOB/AGE: [birth year] 78 y.o.  Admit date: 05/03/2022 Discharge date: 05/04/2022  Admission Diagnoses: Lumbar and lumbosacral spondylolisthesis, facet arthropathy, spinal stenosis, lumbar radiculopathy, neurogenic claudication  Discharge Diagnoses: The same Principal Problem:   Spondylolisthesis of lumbar region   Discharged Condition: good  Hospital Course: I performed an L3-4, L4-5 and L5-S1 decompression, instrumentation and fusion on the patient on 05/03/2022.  The surgery went well.  The patient's postoperative course was unremarkable.  On postoperative day #1 she felt well and requested discharge to home.  The patient, and her daughter, were given verbal and written discharge instructions.  All their questions were answered.  Consults: PT, care management Significant Diagnostic Studies: None Treatments: L3-4, L4-5 and L5-S1 decompression, instrumentation and fusion. Discharge Exam: Blood pressure 106/61, pulse 76, temperature 98.5 F (36.9 C), temperature source Oral, resp. rate 16, height 5\' 3"  (1.6 m), weight 63 kg, SpO2 94 %. The patient is alert and pleasant.  Her strength is normal.  She looks well.  Her dressing is clean and dry.  Disposition: Home  Discharge Instructions     Call MD for:  difficulty breathing, headache or visual disturbances   Complete by: As directed    Call MD for:  extreme fatigue   Complete by: As directed    Call MD for:  hives   Complete by: As directed    Call MD for:  persistant dizziness or light-headedness   Complete by: As directed    Call MD for:  persistant nausea and vomiting   Complete by: As directed    Call MD for:  redness, tenderness, or signs of infection (pain, swelling, redness, odor or green/yellow discharge around incision site)   Complete by: As directed    Call MD for:  severe uncontrolled pain   Complete by: As directed    Call MD for:  temperature  >100.4   Complete by: As directed    Diet - low sodium heart healthy   Complete by: As directed    Discharge instructions   Complete by: As directed    Call [phone] for a followup appointment. Take a stool softener while you are using pain medications.   Driving Restrictions   Complete by: As directed    Do not drive for 2 weeks.   Increase activity slowly   Complete by: As directed    Lifting restrictions   Complete by: As directed    Do not lift more than 5 pounds. No excessive bending or twisting.   May shower / Bathe   Complete by: As directed    Remove the dressing for 3 days after surgery.  You may shower, but leave the incision alone.   Remove dressing in 48 hours   Complete by: As directed       Allergies as of 05/04/2022       Reactions   Keflex [cephalexin] Other (See Comments)   Causes yeast infections        Medication List     STOP taking these medications    LORazepam 1 MG tablet Commonly known as: ATIVAN   meloxicam 7.5 MG tablet Commonly known as: MOBIC       TAKE these medications    acetaminophen 500 MG tablet Commonly known as: TYLENOL Take 500-1,000 mg by mouth 3 (three) times daily as needed (pain.).   alendronate 70 MG tablet Commonly known as: FOSAMAX Take 70 mg by mouth every Sunday. Take  with a full glass of water on an empty stomach.   atorvastatin 40 MG tablet Commonly known as: LIPITOR Take 40 mg by mouth every evening.   cyclobenzaprine 5 MG tablet Commonly known as: FLEXERIL Take 1 tablet (5 mg total) by mouth 3 (three) times daily as needed for muscle spasms.   docusate sodium 100 MG capsule Commonly known as: COLACE Take 1 capsule (100 mg total) by mouth 2 (two) times daily.   MAGNESIUM PO Take 500 mg by mouth at bedtime.   OPTIVE 0.5-0.9 % ophthalmic solution Generic drug: carboxymethylcellul-glycerin Place 1 drop into both eyes 4 (four) times daily as needed (dry/irritated eyes.).    oxyCODONE-acetaminophen 5-325 MG tablet Commonly known as: PERCOCET/ROXICET Take 1-2 tablets by mouth every 4 (four) hours as needed for moderate pain.   VITAMIN D3 PO Take 1,000 Units by mouth in the morning.   zinc sulfate 220 (50 Zn) MG capsule Take 220 mg by mouth in the morning.               Durable Medical Equipment  (From admission, onward)           Start     Ordered   05/04/22 1229  For home use only DME 3 n 1  Once        05/04/22 1228   05/04/22 1228  For home use only DME Walker rolling  Once       Question Answer Comment  Walker: With 5 Inch Wheels   Patient needs a walker to treat with the following condition S/P lumbar spinal fusion      05/04/22 1228             Signed: Cristi Loron 05/04/2022, 12:33 PM

## 2022-05-17 MED FILL — Sodium Chloride IV Soln 0.9%: INTRAVENOUS | Qty: 2000 | Status: AC

## 2022-05-17 MED FILL — Heparin Sodium (Porcine) Inj 1000 Unit/ML: INTRAMUSCULAR | Qty: 30 | Status: AC

## 2022-05-30 DIAGNOSIS — M4316 Spondylolisthesis, lumbar region: Secondary | ICD-10-CM | POA: Diagnosis not present

## 2022-06-23 ENCOUNTER — Other Ambulatory Visit (HOSPITAL_COMMUNITY): Payer: Self-pay | Admitting: Neurosurgery

## 2022-06-23 ENCOUNTER — Encounter (HOSPITAL_COMMUNITY): Payer: Self-pay

## 2022-06-23 ENCOUNTER — Ambulatory Visit (HOSPITAL_COMMUNITY): Admission: RE | Admit: 2022-06-23 | Payer: Medicare PPO | Source: Ambulatory Visit

## 2022-06-23 DIAGNOSIS — M7989 Other specified soft tissue disorders: Secondary | ICD-10-CM

## 2022-07-07 IMAGING — MG MM DIGITAL SCREENING BILAT W/ TOMO AND CAD
8 series · 9 of 24 positions shown · non-contrast
Comparison: Previous exam(s).

CLINICAL DATA: Screening.

EXAM:
DIGITAL SCREENING BILATERAL MAMMOGRAM WITH TOMOSYNTHESIS AND CAD
TECHNIQUE: Bilateral screening digital craniocaudal and mediolateral oblique
mammograms were obtained. Bilateral screening digital breast
tomosynthesis was performed. The images were evaluated with
computer-aided detection.

[L CC synth-2D]
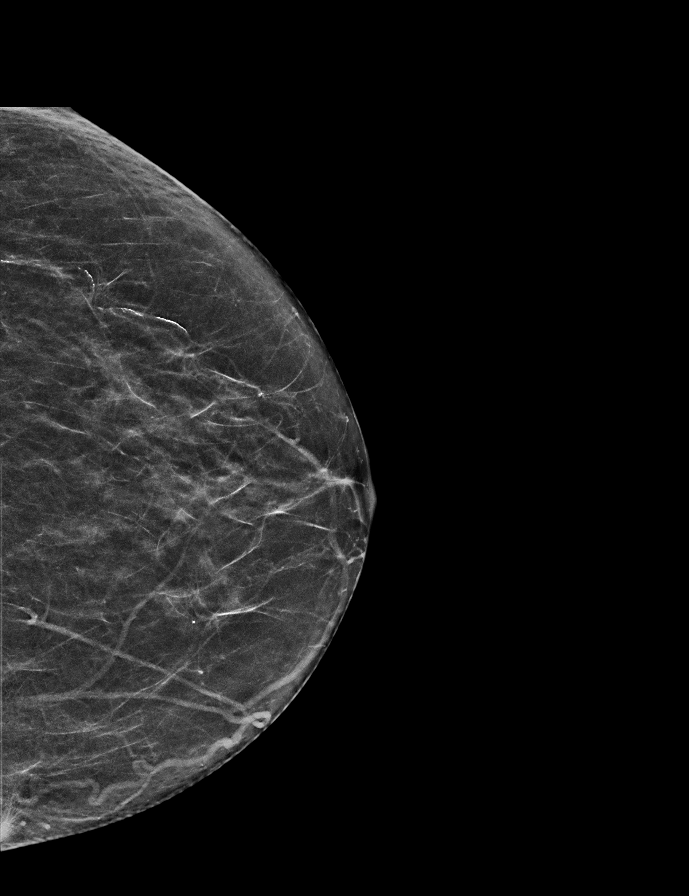

[R MLO synth-2D]
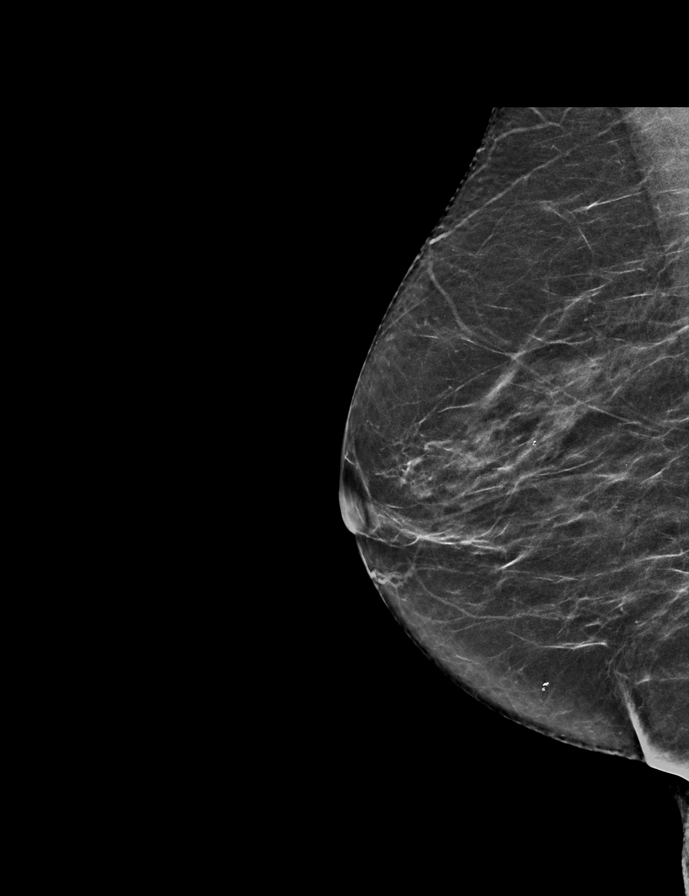

[R CC synth-2D]
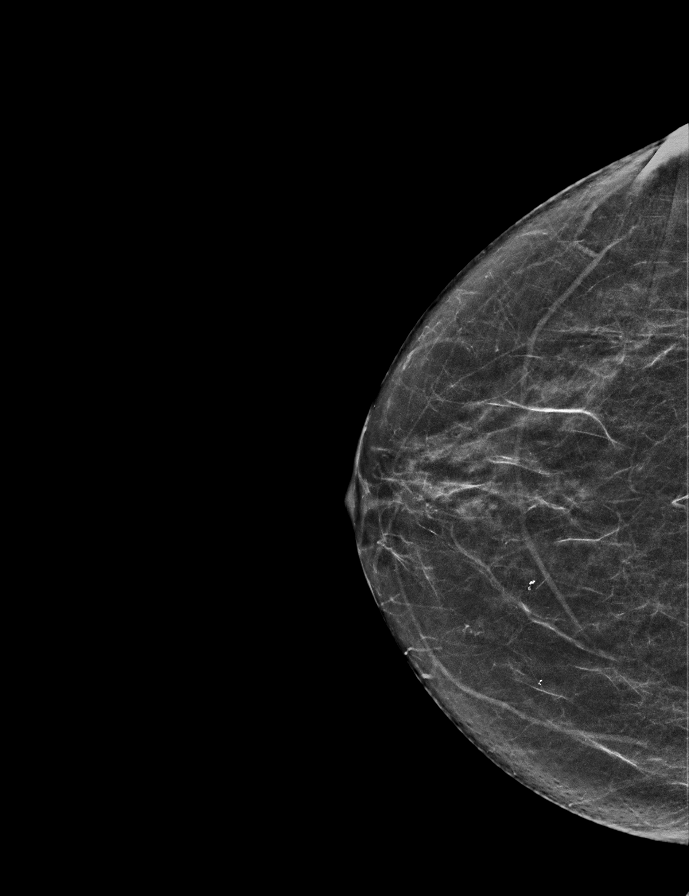

[L MLO synth-2D]
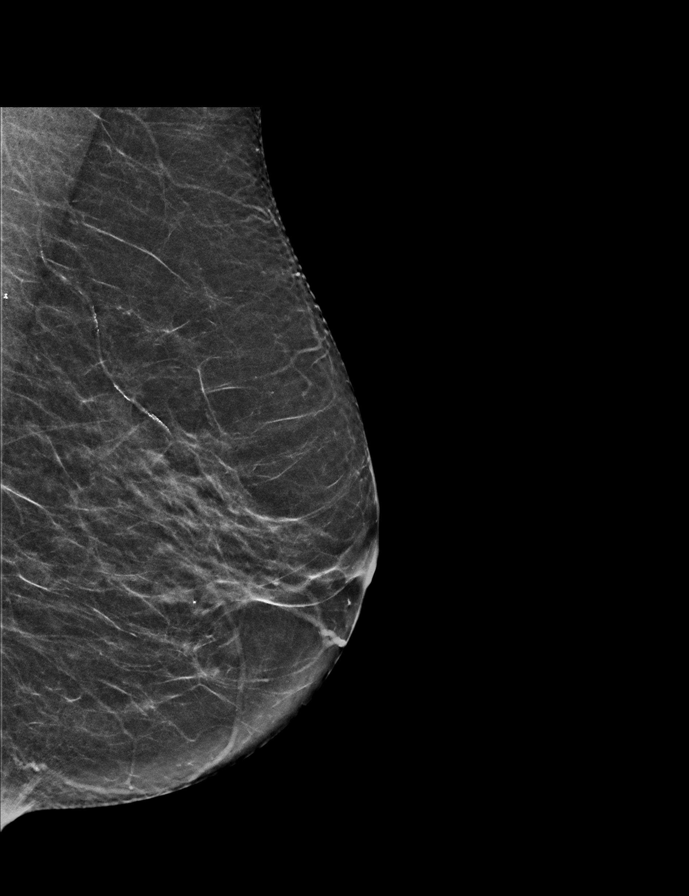

[L CC tomo · 2 of 53 frames shown]
[frame 18/53]
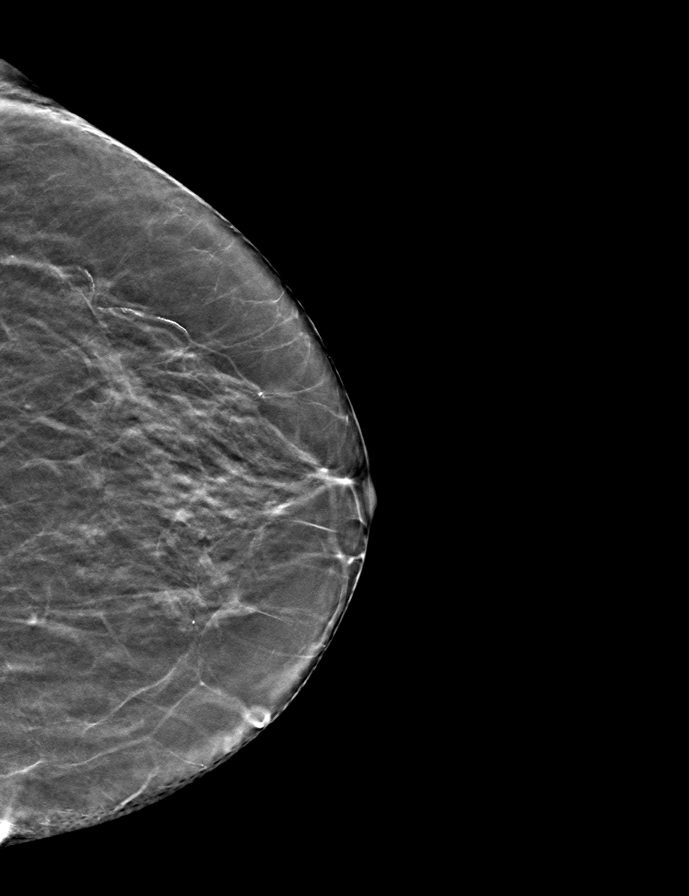
[frame 27/53]
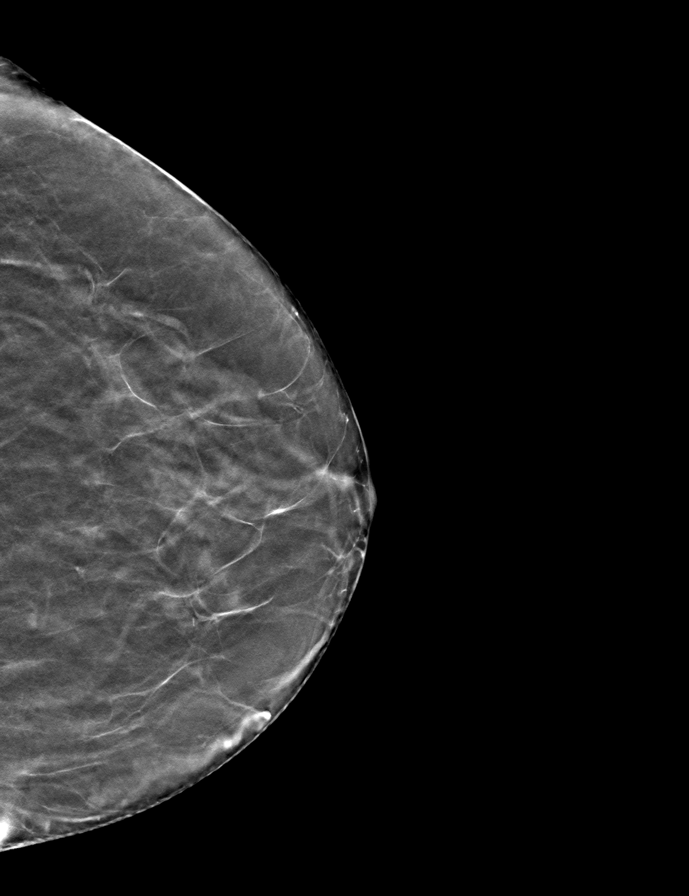

[L MLO tomo · tomo slice 27/52.0]
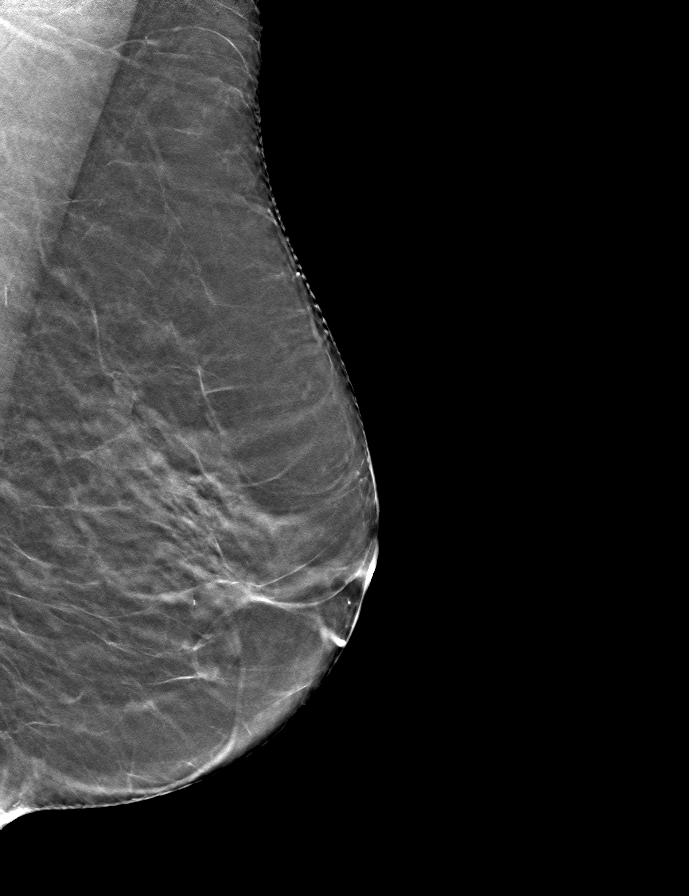

[R MLO tomo · tomo slice 27/54.0]
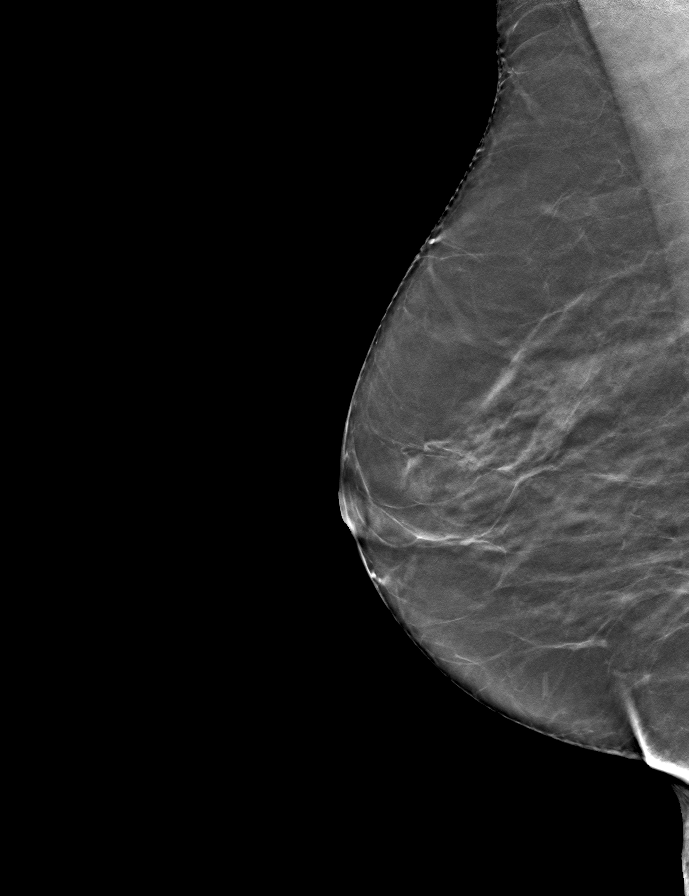

[R CC tomo · tomo slice 27/52.0]
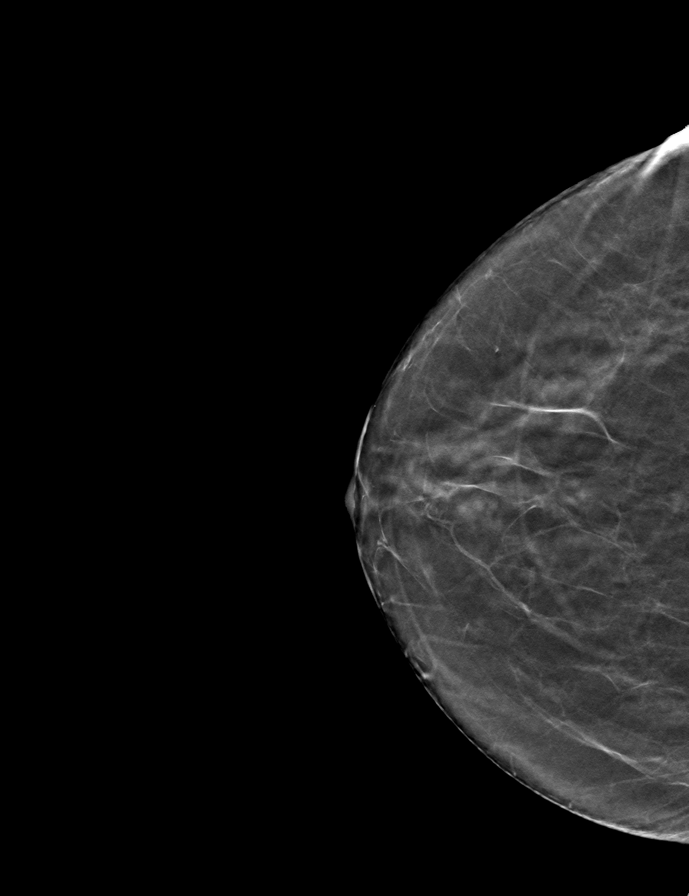

[9 of 24 positions shown; findings below may reference images not displayed]

ACR Breast Density Category b: There are scattered areas of
fibroglandular density.
FINDINGS: There are no findings suspicious for malignancy. The images were
evaluated with computer-aided detection.
IMPRESSION: No mammographic evidence of malignancy. A result letter of this
screening mammogram will be mailed directly to the patient.

RECOMMENDATION:
Screening mammogram in one year. (Code:[id number]-[id number])

BI-RADS CATEGORY  1: Negative.

## 2022-08-15 ENCOUNTER — Other Ambulatory Visit (HOSPITAL_BASED_OUTPATIENT_CLINIC_OR_DEPARTMENT_OTHER): Payer: Self-pay | Admitting: Student

## 2022-08-15 ENCOUNTER — Other Ambulatory Visit (HOSPITAL_COMMUNITY): Payer: Self-pay | Admitting: Neurosurgery

## 2022-08-15 DIAGNOSIS — R2243 Localized swelling, mass and lump, lower limb, bilateral: Secondary | ICD-10-CM

## 2022-08-22 ENCOUNTER — Other Ambulatory Visit (HOSPITAL_COMMUNITY): Payer: Self-pay | Admitting: Internal Medicine

## 2022-08-22 DIAGNOSIS — Z1231 Encounter for screening mammogram for malignant neoplasm of breast: Secondary | ICD-10-CM

## 2022-08-23 ENCOUNTER — Ambulatory Visit (HOSPITAL_COMMUNITY)
Admission: RE | Admit: 2022-08-23 | Discharge: 2022-08-23 | Disposition: A | Discharge status: Home / Self Care | Payer: Medicare PPO | Source: Ambulatory Visit | Attending: Student | Admitting: Student

## 2022-08-23 DIAGNOSIS — R2243 Localized swelling, mass and lump, lower limb, bilateral: Secondary | ICD-10-CM | POA: Diagnosis not present

## 2022-08-23 DIAGNOSIS — R600 Localized edema: Secondary | ICD-10-CM | POA: Diagnosis not present

## 2022-08-28 ENCOUNTER — Ambulatory Visit (HOSPITAL_COMMUNITY)
Admission: RE | Admit: 2022-08-28 | Discharge: 2022-08-28 | Disposition: A | Discharge status: Home / Self Care | Payer: Medicare PPO | Source: Ambulatory Visit | Attending: Internal Medicine | Admitting: Internal Medicine

## 2022-08-28 ENCOUNTER — Encounter (HOSPITAL_COMMUNITY): Payer: Self-pay

## 2022-08-28 DIAGNOSIS — Z1231 Encounter for screening mammogram for malignant neoplasm of breast: Secondary | ICD-10-CM | POA: Insufficient documentation

## 2022-08-29 DIAGNOSIS — R200 Anesthesia of skin: Secondary | ICD-10-CM | POA: Diagnosis not present

## 2022-08-29 DIAGNOSIS — R202 Paresthesia of skin: Secondary | ICD-10-CM | POA: Diagnosis not present

## 2022-08-29 DIAGNOSIS — M47816 Spondylosis without myelopathy or radiculopathy, lumbar region: Secondary | ICD-10-CM | POA: Diagnosis not present

## 2022-08-29 DIAGNOSIS — M7989 Other specified soft tissue disorders: Secondary | ICD-10-CM | POA: Diagnosis not present

## 2022-08-29 DIAGNOSIS — M4316 Spondylolisthesis, lumbar region: Secondary | ICD-10-CM | POA: Diagnosis not present

## 2022-10-04 DIAGNOSIS — R030 Elevated blood-pressure reading, without diagnosis of hypertension: Secondary | ICD-10-CM | POA: Diagnosis not present

## 2022-10-04 DIAGNOSIS — F419 Anxiety disorder, unspecified: Secondary | ICD-10-CM | POA: Diagnosis not present

## 2022-10-04 DIAGNOSIS — R200 Anesthesia of skin: Secondary | ICD-10-CM | POA: Diagnosis not present

## 2022-10-04 DIAGNOSIS — Z79899 Other long term (current) drug therapy: Secondary | ICD-10-CM | POA: Diagnosis not present

## 2022-10-11 DIAGNOSIS — R7309 Other abnormal glucose: Secondary | ICD-10-CM | POA: Diagnosis not present

## 2022-10-11 DIAGNOSIS — M1990 Unspecified osteoarthritis, unspecified site: Secondary | ICD-10-CM | POA: Diagnosis not present

## 2022-10-11 DIAGNOSIS — D721 Eosinophilia, unspecified: Secondary | ICD-10-CM | POA: Diagnosis not present

## 2022-10-11 DIAGNOSIS — M810 Age-related osteoporosis without current pathological fracture: Secondary | ICD-10-CM | POA: Diagnosis not present

## 2022-10-11 DIAGNOSIS — M5136 Other intervertebral disc degeneration, lumbar region: Secondary | ICD-10-CM | POA: Diagnosis not present

## 2022-10-11 DIAGNOSIS — G4700 Insomnia, unspecified: Secondary | ICD-10-CM | POA: Diagnosis not present

## 2022-10-11 DIAGNOSIS — J309 Allergic rhinitis, unspecified: Secondary | ICD-10-CM | POA: Diagnosis not present

## 2022-10-11 DIAGNOSIS — Z0001 Encounter for general adult medical examination with abnormal findings: Secondary | ICD-10-CM | POA: Diagnosis not present

## 2022-10-11 DIAGNOSIS — I700 Atherosclerosis of aorta: Secondary | ICD-10-CM | POA: Diagnosis not present

## 2022-10-17 DIAGNOSIS — M47816 Spondylosis without myelopathy or radiculopathy, lumbar region: Secondary | ICD-10-CM | POA: Diagnosis not present

## 2022-10-17 DIAGNOSIS — R200 Anesthesia of skin: Secondary | ICD-10-CM | POA: Diagnosis not present

## 2022-11-15 ENCOUNTER — Ambulatory Visit (HOSPITAL_COMMUNITY): Payer: Medicare PPO | Attending: Student

## 2022-11-15 ENCOUNTER — Other Ambulatory Visit: Payer: Self-pay

## 2022-11-15 ENCOUNTER — Encounter (HOSPITAL_COMMUNITY): Payer: Self-pay

## 2022-11-15 DIAGNOSIS — M47816 Spondylosis without myelopathy or radiculopathy, lumbar region: Secondary | ICD-10-CM | POA: Insufficient documentation

## 2022-11-15 DIAGNOSIS — M6281 Muscle weakness (generalized): Secondary | ICD-10-CM | POA: Diagnosis not present

## 2022-11-15 NOTE — Therapy (Incomplete)
OUTPATIENT PHYSICAL THERAPY THORACOLUMBAR EVALUATION   Patient Name: Casey Armstrong MRN: [id number] DOB:18-Mar-1945, 77 y.o., female Today's Date: 11/16/2022  END OF SESSION:  PT End of Session - 11/15/22 1435     Visit Number 1    Date for PT Re-Evaluation 01/10/23    Authorization Type [employer]    Authorization Time Period Seeking auth    PT Start Time 1347    PT Stop Time 1430    PT Time Calculation (min) 43 min    Behavior During Therapy WFL for tasks assessed/performed             Past Medical History:  Diagnosis Date   Arthritis    Headache    Lichen sclerosus    Neuromuscular disorder (HCC)    PONV (postoperative nausea and vomiting)    Past Surgical History:  Procedure Laterality Date   CATARACT EXTRACTION W/PHACO  08/15/2010   Procedure: CATARACT EXTRACTION PHACO AND INTRAOCULAR LENS PLACEMENT (IOC);  Surgeon: Gemma Payor;  Location: [facility];  Service: Ophthalmology;  Laterality: Left;  CDE 14.67   CATARACT EXTRACTION W/PHACO  08/25/2010   Procedure: CATARACT EXTRACTION PHACO AND INTRAOCULAR LENS PLACEMENT (IOC);  Surgeon: Gemma Payor;  Location: [facility];  Service: Ophthalmology;  Laterality: Right;  CDE 12.65   COLONOSCOPY  01/30/2011   Procedure: COLONOSCOPY;  Surgeon: Corbin Ade, MD;  Location: [facility];  Service: Endoscopy;  Laterality: N/A;  8:30 AM   DILATION AND CURETTAGE OF UTERUS     30+ yrs ago   EYE SURGERY     left KPE w/ IOL   TONSILLECTOMY     TUBAL LIGATION     Patient Active Problem List   Diagnosis Date Noted   Spondylolisthesis of lumbar region 05/03/2022   Superficial fungus infection of skin 03/03/2020   Vulvar itching 06/05/2016   Lichen sclerosus et atrophicus of the vulva 02/03/2016    PCP: Carylon Perches MD  REFERRING PROVIDER: Floreen Comber, NP   REFERRING DIAG: [phone] (ICD-10-CM) - Spondylosis without myelopathy or radiculopathy, lumbar region   Rationale for Evaluation and Treatment: Rehabilitation  THERAPY  DIAG:  Spondylosis of lumbar region without myelopathy or radiculopathy  Muscle weakness (generalized)  ONSET DATE: 05/03/2022  SUBJECTIVE:                                                                                                                                                                                           SUBJECTIVE STATEMENT: Pt had fusion in lumbar since on 05/03/22. Increased activity creates more pain in low back. Bathing causes most pain. Standing shower. After periods of stillness,  pain is more noticeable. Sleeps supine with pillow underneath knees. Pt reports that she paying someone to do yard work due to pain. Only is performing hip level height activities, limited bending activities. Uses grabber to pickup various dropped objects. Pt reports driving. Limited rotation when driving and looking out of blind spot.   PERTINENT HISTORY:  Posterior Fusion   PAIN:  Are you having pain? Yes: NPRS scale: 4/10 Pain location: Bilateral SIJ and spans out laterally Pain description: Achy, sharp, tingles and just "hurts" its just annoying Aggravating factors: long term functional activities Relieving factors: "nothing"  PRECAUTIONS: None  RED FLAGS: None   WEIGHT BEARING RESTRICTIONS: No  FALLS:  Has patient fallen in last 6 months? No  LIVING ENVIRONMENT: Lives with: lives alone Lives in: House/apartment Stairs: Yes: Internal: 1 flight steps; on left going up and External: 2 steps; on left going up Has following equipment at home: Walker - 2 wheeled and bed side commode  OCCUPATION: Retired; cares for grandchild  PLOF: Independent  PATIENT GOALS: "bend over without hurting"  NEXT MD VISIT: Next Month  OBJECTIVE:  Note: Objective measures were completed at Evaluation unless otherwise noted.  DIAGNOSTIC FINDINGS:    PATIENT SURVEYS:  Modified Oswestry 24/50    COGNITION: Overall cognitive status: Within functional limits for tasks  assessed     SENSATION: WFL   POSTURE:   PALPATION: Increased tension through lumbar paraspinals  LUMBAR ROM:   AROM eval  Flexion 70%  Extension 10%  Right lateral flexion 20%  Left lateral flexion 20%  Right rotation   Left rotation    (Blank rows = not tested)  LOWER EXTREMITY ROM:     Active  Right eval Left eval  Hip flexion    Hip extension    Hip abduction    Hip adduction    Hip internal rotation    Hip external rotation    Knee flexion    Knee extension    Ankle dorsiflexion    Ankle plantarflexion    Ankle inversion    Ankle eversion     (Blank rows = not tested)  LOWER EXTREMITY MMT:    MMT Right eval Left eval  Hip flexion 3- 3+  Hip extension 3 3  Hip abduction 3+ 3+  Hip adduction    Hip internal rotation    Hip external rotation    Knee flexion    Knee extension 4- 4-  Ankle dorsiflexion    Ankle plantarflexion    Ankle inversion    Ankle eversion     (Blank rows = not tested)    FUNCTIONAL TESTS:  30 seconds chair stand test:next session 2 minute walk test: 350ft  GAIT: Distance walked: 310 feet Assistive device utilized: None Level of assistance: Complete Independence Comments: Left lean in trunk with bilateral trendelenberg, R shoulder elevated during ambulation.    TODAY'S TREATMENT:  DATE:   11/15/2022 PT Evaluation, findings, prognosis, frequency, attendance policy, and HEP if given.      PATIENT EDUCATION:  Education details: PT Evaluation, findings, prognosis, frequency, attendance policy, and HEP if given.  Person educated: Patient Education method: Medical illustrator Education comprehension: verbalized understanding  HOME EXERCISE PROGRAM: Continue with log rolling for bed mobility due to stiffness and pain.   ASSESSMENT:  CLINICAL IMPRESSION: Patient is a 77 y.o. female  who was seen today for physical therapy evaluation and treatment for  M47.816 (ICD-10-CM) - Spondylosis without myelopathy or radiculopathy, lumbar region. Pt showing mild limitations in functional mobility, bending, lifting, carrying during functional ADLs noted by outcome measure above. Limitations in functional activities due to muscle weakness, ROM limitations, and pain.    OBJECTIVE IMPAIRMENTS: Abnormal gait, decreased balance, decreased coordination, decreased mobility, decreased ROM, decreased strength, impaired flexibility, and pain.   ACTIVITY LIMITATIONS: carrying, lifting, bending, sitting, standing, squatting, sleeping, transfers, and locomotion level  PARTICIPATION LIMITATIONS: cleaning, laundry, shopping, community activity, occupation, and yard work  PERSONAL FACTORS: Age are also affecting patient's functional outcome.   REHAB POTENTIAL: Good  CLINICAL DECISION MAKING: Stable/uncomplicated  EVALUATION COMPLEXITY: Low   GOALS: Goals reviewed with patient? No  SHORT TERM GOALS: Target date: 12/14/18  Pt will be independent with HEP in order to demonstrate participation in Physical Therapy POC.  Baseline: Goal status: INITIAL  2.  Pt will report 3/10 pain with mobility in order to demonstrate improved pain with functional activities.  Baseline: 7/10 worst Goal status: INITIAL  LONG TERM GOALS: Target date: 01/11/2023  Pt will improve 30 second chair test by MCID in order to demonstrate improved functional strength to return to desired activities.  Baseline: next session Goal status: INITIAL  2.  Pt will improve bilateral hip MMT by > 1/2 grade in order to improve pain tolerance during community mobility activities. .  Baseline: see MMT Goal status: INITIAL  3.  Pt will improve Modified Oswestry score by > 5 points in order to demonstrate improved pain with functional goals and outcomes. Baseline: see objective Goal status: INITIAL  4.  Pt will report 2/10  pain with mobility in order to demonstrate reduced pain with overall functional status.  Baseline: see objective Goal status: INITIAL  PLAN:  PT FREQUENCY: 2x/week  PT DURATION: 8 weeks  PLANNED INTERVENTIONS: 97164- PT Re-evaluation, 97110-Therapeutic exercises, 97530- Therapeutic activity, 97112- Neuromuscular re-education, 97535- Self Care, 69629- Manual therapy, [phone]- Gait training, Balance training, and Stair training.  PLAN FOR NEXT SESSION: 30 second chair test, DGI  Nelida Meuse PT, DPT Physical Therapist with [facility] [phone] office   Nelida Meuse, PT 11/16/2022, 8:45 AM

## 2022-11-16 ENCOUNTER — Encounter (HOSPITAL_COMMUNITY): Payer: Self-pay

## 2022-11-16 ENCOUNTER — Ambulatory Visit (HOSPITAL_COMMUNITY): Payer: Medicare PPO

## 2022-11-16 DIAGNOSIS — M6281 Muscle weakness (generalized): Secondary | ICD-10-CM

## 2022-11-16 DIAGNOSIS — M47816 Spondylosis without myelopathy or radiculopathy, lumbar region: Secondary | ICD-10-CM | POA: Diagnosis not present

## 2022-11-16 NOTE — Therapy (Signed)
OUTPATIENT PHYSICAL THERAPY THORACOLUMBAR EVALUATION   Patient Name: Casey Armstrong MRN: [id number] DOB:09-14-1945, 77 y.o., female Today's Date: 11/16/2022  END OF SESSION:  PT End of Session - 11/16/22 1525     Visit Number 2    Number of Visits 12    Date for PT Re-Evaluation 01/13/23    Authorization Type [employer]    Authorization Time Period 12v-pending date -01/13/23    Authorization - Visit Number 1    Authorization - Number of Visits 12    Progress Note Due on Visit 10    PT Start Time 1432    PT Stop Time 1517    PT Time Calculation (min) 45 min    Behavior During Therapy [facility] for tasks assessed/performed              Past Medical History:  Diagnosis Date   Arthritis    Headache    Lichen sclerosus    Neuromuscular disorder (HCC)    PONV (postoperative nausea and vomiting)    Past Surgical History:  Procedure Laterality Date   CATARACT EXTRACTION W/PHACO  08/15/2010   Procedure: CATARACT EXTRACTION PHACO AND INTRAOCULAR LENS PLACEMENT (IOC);  Surgeon: Gemma Payor;  Location: [facility];  Service: Ophthalmology;  Laterality: Left;  CDE 14.67   CATARACT EXTRACTION W/PHACO  08/25/2010   Procedure: CATARACT EXTRACTION PHACO AND INTRAOCULAR LENS PLACEMENT (IOC);  Surgeon: Gemma Payor;  Location: [facility];  Service: Ophthalmology;  Laterality: Right;  CDE 12.65   COLONOSCOPY  01/30/2011   Procedure: COLONOSCOPY;  Surgeon: Corbin Ade, MD;  Location: [facility];  Service: Endoscopy;  Laterality: N/A;  8:30 AM   DILATION AND CURETTAGE OF UTERUS     30+ yrs ago   EYE SURGERY     left KPE w/ IOL   TONSILLECTOMY     TUBAL LIGATION     Patient Active Problem List   Diagnosis Date Noted   Spondylolisthesis of lumbar region 05/03/2022   Superficial fungus infection of skin 03/03/2020   Vulvar itching 06/05/2016   Lichen sclerosus et atrophicus of the vulva 02/03/2016    PCP: Carylon Perches MD  REFERRING PROVIDER: Floreen Comber, NP   REFERRING DIAG: [phone]  (ICD-10-CM) - Spondylosis without myelopathy or radiculopathy, lumbar region   Rationale for Evaluation and Treatment: Rehabilitation  THERAPY DIAG:  Spondylosis of lumbar region without myelopathy or radiculopathy  Muscle weakness (generalized)  ONSET DATE: 05/03/2022  SUBJECTIVE:  SUBJECTIVE STATEMENT: Pt reporting that she wanted to discuss from evaluation that she has been experiencing more trouble with bowel/baldder. 4/10 pain currently. 7/10 last night after rolling on mat table last night.   PERTINENT HISTORY:  Posterior Fusion   PAIN:  Are you having pain? Yes: NPRS scale: 4/10 Pain location: Bilateral SIJ and spans out laterally Pain description: Achy, sharp, tingles and just "hurts" its just annoying Aggravating factors: long term functional activities Relieving factors: "nothing"  PRECAUTIONS: None  RED FLAGS: None   WEIGHT BEARING RESTRICTIONS: No  FALLS:  Has patient fallen in last 6 months? No  LIVING ENVIRONMENT: Lives with: lives alone Lives in: House/apartment Stairs: Yes: Internal: 1 flight steps; on left going up and External: 2 steps; on left going up Has following equipment at home: Walker - 2 wheeled and bed side commode  OCCUPATION: Retired; cares for grandchild  PLOF: Independent  PATIENT GOALS: "bend over without hurting"  NEXT MD VISIT: Next Month  OBJECTIVE:  Note: Objective measures were completed at Evaluation unless otherwise noted.  DIAGNOSTIC FINDINGS:    PATIENT SURVEYS:  Modified Oswestry 24/50    COGNITION: Overall cognitive status: Within functional limits for tasks assessed     SENSATION: WFL   POSTURE:   PALPATION: Increased tension through lumbar paraspinals  LUMBAR ROM:   AROM eval  Flexion 70%  Extension 10%  Right  lateral flexion 20%  Left lateral flexion 20%  Right rotation   Left rotation    (Blank rows = not tested)  LOWER EXTREMITY ROM:     Active  Right eval Left eval  Hip flexion    Hip extension    Hip abduction    Hip adduction    Hip internal rotation    Hip external rotation    Knee flexion    Knee extension    Ankle dorsiflexion    Ankle plantarflexion    Ankle inversion    Ankle eversion     (Blank rows = not tested)  LOWER EXTREMITY MMT:    MMT Right eval Left eval  Hip flexion 3- 3+  Hip extension 3 3  Hip abduction 3+ 3+  Hip adduction    Hip internal rotation    Hip external rotation    Knee flexion    Knee extension 4- 4-  Ankle dorsiflexion    Ankle plantarflexion    Ankle inversion    Ankle eversion     (Blank rows = not tested)    FUNCTIONAL TESTS:  30 seconds chair stand test:next session 2 minute walk test: 319ft  GAIT: Distance walked: 310 feet Assistive device utilized: None Level of assistance: Complete Independence Comments: Left lean in trunk with bilateral trendelenberg, R shoulder elevated during ambulation.    TODAY'S TREATMENT:  DATE:  11/16/2022  -30 second chair test: 9x -10x wall reaches for lumbar extension and lumbar elongatoin.  -Forward flexion theraball sitting x 5 -Lateral seated flexion theraball sitting x5 bilaterally with 3-5' hold -LTR in supine x30 -Clamshells without resistance x 15 bilaterally  -throughout bed level TE, education and tactile cuing provided for bed mobility with log rolling.   11/15/2022 PT Evaluation, findings, prognosis, frequency, attendance policy, and HEP if given.      PATIENT EDUCATION:  Education details: PT Evaluation, findings, prognosis, frequency, attendance policy, and HEP if given.  Person educated: Patient Education method: Software engineer Education comprehension: verbalized understanding  HOME EXERCISE PROGRAM: Access Code: [email or web address] URL: [email or web address] Date: 11/16/2022 Prepared by: Starling Manns  Exercises - Supine Lower Trunk Rotation  - 1 x daily - 7 x weekly - 3 sets - 10 reps - Clamshell  - 1 x daily - 7 x weekly - 3 sets - 10 reps - Supine Bridge  - 1 x daily - 7 x weekly - 3 sets - 10 reps  ASSESSMENT:  CLINICAL IMPRESSION: Pt tolerating first treatment session well. Continues with 4/10 pain, bed mobility causing increase in pain, consistent education and cues provided this session for true log rolling, pt performing segmented movement and rotation, causing increase in pain. Gentle ROM initiated with low level strengthening of hip musculature. Continue to followup with bed mobility carryover. Progress strengthening as able. Target glutes and hamstrings as pt reporting significant cramping with hip extension in hamstrings.  OBJECTIVE IMPAIRMENTS: Abnormal gait, decreased balance, decreased coordination, decreased mobility, decreased ROM, decreased strength, impaired flexibility, and pain.   ACTIVITY LIMITATIONS: carrying, lifting, bending, sitting, standing, squatting, sleeping, transfers, and locomotion level  PARTICIPATION LIMITATIONS: cleaning, laundry, shopping, community activity, occupation, and yard work  PERSONAL FACTORS: Age are also affecting patient's functional outcome.   REHAB POTENTIAL: Good  CLINICAL DECISION MAKING: Stable/uncomplicated  EVALUATION COMPLEXITY: Low   GOALS: Goals reviewed with patient? No  SHORT TERM GOALS: Target date: 12/14/18  Pt will be independent with HEP in order to demonstrate participation in Physical Therapy POC.  Baseline: Goal status: INITIAL  2.  Pt will report 3/10 pain with mobility in order to demonstrate improved pain with functional activities.  Baseline: 7/10 worst Goal status: INITIAL  LONG TERM GOALS: Target  date: 01/11/2023  Pt will improve 30 second chair test by MC[facility] in order to demonstrate improved functional strength to return to desired activities.  Baseline: next session Goal status: INITIAL  2.  Pt will improve bilateral hip MMT by > 1/2 grade in order to improve pain tolerance during community mobility activities. .  Baseline: see MMT Goal status: INITIAL  3.  Pt will improve Modified Oswestry score by > 5 points in order to demonstrate improved pain with functional goals and outcomes. Baseline: see objective Goal status: INITIAL  4.  Pt will report 2/10 pain with mobility in order to demonstrate reduced pain with overall functional status.  Baseline: see objective Goal status: INITIAL  PLAN:  PT FREQUENCY: 2x/week  PT DURATION: 8 weeks  PLANNED INTERVENTIONS: 97164- PT Re-evaluation, 97110-Therapeutic exercises, 97530- Therapeutic activity, 97112- Neuromuscular re-education, 97535- Self Care, 56213- Manual therapy, [phone]- Gait training, Balance training, and Stair training.  PLAN FOR NEXT SESSION: 30 second chair test, DGI  Nelida Meuse PT, DPT Physical Therapist with [facility] [phone] office   Nelida Meuse, PT 11/16/2022, 3:31 PM

## 2022-11-20 ENCOUNTER — Ambulatory Visit (HOSPITAL_COMMUNITY): Payer: Medicare PPO

## 2022-11-20 ENCOUNTER — Encounter (HOSPITAL_COMMUNITY): Payer: Self-pay

## 2022-11-20 DIAGNOSIS — M6281 Muscle weakness (generalized): Secondary | ICD-10-CM | POA: Diagnosis not present

## 2022-11-20 DIAGNOSIS — M47816 Spondylosis without myelopathy or radiculopathy, lumbar region: Secondary | ICD-10-CM | POA: Diagnosis not present

## 2022-11-20 NOTE — Therapy (Signed)
OUTPATIENT PHYSICAL THERAPY THORACOLUMBAR TREATMENT   Patient Name: Casey Armstrong MRN: [id number] DOB:04-07-1945, 77 y.o., female Today's Date: 11/20/2022  END OF SESSION:  PT End of Session - 11/20/22 1240     Visit Number 3    Number of Visits 12    Date for PT Re-Evaluation 01/13/23    Authorization Type [employer]    Authorization Time Period 12v-pending date -01/13/23    Authorization - Visit Number 2    Authorization - Number of Visits 12    Progress Note Due on Visit 10    PT Start Time 1153    PT Stop Time 1231    PT Time Calculation (min) 38 min    Activity Tolerance Patient tolerated treatment well    Behavior During Therapy WFL for tasks assessed/performed               Past Medical History:  Diagnosis Date   Arthritis    Headache    Lichen sclerosus    Neuromuscular disorder (HCC)    PONV (postoperative nausea and vomiting)    Past Surgical History:  Procedure Laterality Date   CATARACT EXTRACTION W/PHACO  08/15/2010   Procedure: CATARACT EXTRACTION PHACO AND INTRAOCULAR LENS PLACEMENT (IOC);  Surgeon: Gemma Payor;  Location: [facility];  Service: Ophthalmology;  Laterality: Left;  CDE 14.67   CATARACT EXTRACTION W/PHACO  08/25/2010   Procedure: CATARACT EXTRACTION PHACO AND INTRAOCULAR LENS PLACEMENT (IOC);  Surgeon: Gemma Payor;  Location: [facility];  Service: Ophthalmology;  Laterality: Right;  CDE 12.65   COLONOSCOPY  01/30/2011   Procedure: COLONOSCOPY;  Surgeon: Corbin Ade, MD;  [facility];  Service: Endoscopy;  Laterality: N/A;  8:30 AM   DILATION AND CURETTAGE OF UTERUS     30+ yrs ago   EYE SURGERY     left KPE w/ IOL   TONSILLECTOMY     TUBAL LIGATION     Patient Active Problem List   Diagnosis Date Noted   Spondylolisthesis of lumbar region 05/03/2022   Superficial fungus infection of skin 03/03/2020   Vulvar itching 06/05/2016   Lichen sclerosus et atrophicus of the vulva 02/03/2016    PCP: Carylon Perches MD  REFERRING  PROVIDER: Floreen Comber, NP   REFERRING DIAG: [phone] (ICD-10-CM) - Spondylosis without myelopathy or radiculopathy, lumbar region   Rationale for Evaluation and Treatment: Rehabilitation  THERAPY DIAG:  Spondylosis of lumbar region without myelopathy or radiculopathy  Muscle weakness (generalized)  ONSET DATE: 05/03/2022  SUBJECTIVE:  SUBJECTIVE STATEMENT: 3/10 pain today, had more pain on Saturday due to spending a lot of time on feet.   PERTINENT HISTORY:  Posterior Fusion   PAIN:  Are you having pain? Yes: NPRS scale: 4/10 Pain location: Bilateral SIJ and spans out laterally Pain description: Achy, sharp, tingles and just "hurts" its just annoying Aggravating factors: long term functional activities Relieving factors: "nothing"  PRECAUTIONS: None  RED FLAGS: None   WEIGHT BEARING RESTRICTIONS: No  FALLS:  Has patient fallen in last 6 months? No  LIVING ENVIRONMENT: Lives with: lives alone Lives in: House/apartment Stairs: Yes: Internal: 1 flight steps; on left going up and External: 2 steps; on left going up Has following equipment at home: Walker - 2 wheeled and bed side commode  OCCUPATION: Retired; cares for grandchild  PLOF: Independent  PATIENT GOALS: "bend over without hurting"  NEXT MD VISIT: Next Month  OBJECTIVE:  Note: Objective measures were completed at Evaluation unless otherwise noted.  DIAGNOSTIC FINDINGS:    PATIENT SURVEYS:  Modified Oswestry 24/50    COGNITION: Overall cognitive status: Within functional limits for tasks assessed     SENSATION: WFL   POSTURE:   PALPATION: Increased tension through lumbar paraspinals  LUMBAR ROM:   AROM eval  Flexion 70%  Extension 10%  Right lateral flexion 20%  Left lateral flexion 20%  Right  rotation   Left rotation    (Blank rows = not tested)  LOWER EXTREMITY ROM:     Active  Right eval Left eval  Hip flexion    Hip extension    Hip abduction    Hip adduction    Hip internal rotation    Hip external rotation    Knee flexion    Knee extension    Ankle dorsiflexion    Ankle plantarflexion    Ankle inversion    Ankle eversion     (Blank rows = not tested)  LOWER EXTREMITY MMT:    MMT Right eval Left eval  Hip flexion 3- 3+  Hip extension 3 3  Hip abduction 3+ 3+  Hip adduction    Hip internal rotation    Hip external rotation    Knee flexion    Knee extension 4- 4-  Ankle dorsiflexion    Ankle plantarflexion    Ankle inversion    Ankle eversion     (Blank rows = not tested)    FUNCTIONAL TESTS:  30 seconds chair stand test:next session 2 minute walk test: 353ft  GAIT: Distance walked: 310 feet Assistive device utilized: None Level of assistance: Complete Independence Comments: Left lean in trunk with bilateral trendelenberg, R shoulder elevated during ambulation.    TODAY'S TREATMENT:                                                                                                                              DATE:  11/20/2022  -Lateral seated flexion theraball sitting x5 bilaterally with 3-5' hold -  Supine bridges 15 with 3' isometric- cues for posterior pelvic tilt with good carryover -Isometric hooklying hip flexion x 10 with 3' hold -Side stepping with RTB around knees 81ft x 2 with hip hinge and bent knee.  -ADL activity with form modification  to promote reduce LB burden and tolerance. Performing raking and piling leaves in box with moving about 20-10ft with cues for core activation and practice with improve base of support around object to lift.   11/16/2022  -30 second chair test: 9x -10x wall reaches for lumbar extension and lumbar elongatoin.  -Forward flexion theraball sitting x 5 -Lateral seated flexion theraball sitting x5  bilaterally with 3-5' hold -LTR in supine x30 -Clamshells without resistance x 15 bilaterally  -throughout bed level TE, education and tactile cuing provided for bed mobility with log rolling.   11/15/2022 PT Evaluation, findings, prognosis, frequency, attendance policy, and HEP if given.      PATIENT EDUCATION:  Education details: PT Evaluation, findings, prognosis, frequency, attendance policy, and HEP if given.  Person educated: Patient Education method: Medical illustrator Education comprehension: verbalized understanding  HOME EXERCISE PROGRAM: Access Code: [email or web address] URL: [email or web address] Date: 11/16/2022 Prepared by: Starling Manns  Exercises - Supine Lower Trunk Rotation  - 1 x daily - 7 x weekly - 3 sets - 10 reps - Clamshell  - 1 x daily - 7 x weekly - 3 sets - 10 reps - Supine Bridge  - 1 x daily - 7 x weekly - 3 sets - 10 reps  ASSESSMENT:  CLINICAL IMPRESSION: Pt tolerating session well. Focusing on continue core and hip strengthening with incorporation into ADLs around the home with raking. Pt responds well to cues and demonstrating great return for posterior tilting in order to set of TA strengthening. Shows preference for lifting objects form lumbar flexion in to extension without use of BLE, educated and irritated proper core activation with improved lifting mechanics. No reports of increased in pain, reports appropriate fatigue.Marland Kitchen Pt will continue to benefit from skilled PT services to address funcitonal deficits, pain, in order to improve quality of life.   OBJECTIVE IMPAIRMENTS: Abnormal gait, decreased balance, decreased coordination, decreased mobility, decreased ROM, decreased strength, impaired flexibility, and pain.   ACTIVITY LIMITATIONS: carrying, lifting, bending, sitting, standing, squatting, sleeping, transfers, and locomotion level  PARTICIPATION LIMITATIONS: cleaning, laundry, shopping, community activity, occupation, and  yard work  PERSONAL FACTORS: Age are also affecting patient's functional outcome.   REHAB POTENTIAL: Good  CLINICAL DECISION MAKING: Stable/uncomplicated  EVALUATION COMPLEXITY: Low   GOALS: Goals reviewed with patient? No  SHORT TERM GOALS: Target date: 12/14/18  Pt will be independent with HEP in order to demonstrate participation in Physical Therapy POC.  Baseline: Goal status: INITIAL  2.  Pt will report 3/10 pain with mobility in order to demonstrate improved pain with functional activities.  Baseline: 7/10 worst Goal status: INITIAL  LONG TERM GOALS: Target date: 01/11/2023  Pt will improve 30 second chair test by MCID in order to demonstrate improved functional strength to return to desired activities.  Baseline: next session Goal status: INITIAL  2.  Pt will improve bilateral hip MMT by > 1/2 grade in order to improve pain tolerance during community mobility activities. .  Baseline: see MMT Goal status: INITIAL  3.  Pt will improve Modified Oswestry score by > 5 points in order to demonstrate improved pain with functional goals and outcomes. Baseline: see objective Goal status: INITIAL  4.  Pt will report 2/10 pain with  mobility in order to demonstrate reduced pain with overall functional status.  Baseline: see objective Goal status: INITIAL  PLAN:  PT FREQUENCY: 2x/week  PT DURATION: 8 weeks  PLANNED INTERVENTIONS: 97164- PT Re-evaluation, 97110-Therapeutic exercises, 97530- Therapeutic activity, 97112- Neuromuscular re-education, 97535- Self Care, 16109- Manual therapy, [phone]- Gait training, Balance training, and Stair training.  PLAN FOR NEXT SESSION: 30 second chair test, DGI  Nelida Meuse PT, DPT Physical Therapist with [facility] [phone] office   Nelida Meuse, PT 11/20/2022, 12:41 PM

## 2022-11-22 ENCOUNTER — Ambulatory Visit (HOSPITAL_COMMUNITY): Payer: Medicare PPO

## 2022-11-22 ENCOUNTER — Encounter (HOSPITAL_COMMUNITY): Payer: Self-pay

## 2022-11-22 DIAGNOSIS — M47816 Spondylosis without myelopathy or radiculopathy, lumbar region: Secondary | ICD-10-CM

## 2022-11-22 DIAGNOSIS — M6281 Muscle weakness (generalized): Secondary | ICD-10-CM | POA: Diagnosis not present

## 2022-11-22 NOTE — Therapy (Signed)
OUTPATIENT PHYSICAL THERAPY THORACOLUMBAR TREATMENT   Patient Name: Casey Armstrong MRN: [id number] DOB:1945-06-23, 77 y.o., female Today's Date: 11/22/2022  END OF SESSION:  PT End of Session - 11/22/22 1231     Visit Number 4    Number of Visits 12    Date for PT Re-Evaluation 01/13/23    Authorization Type [employer]    Authorization Time Period 12v-pending date -01/13/23    Authorization - Visit Number 3    Authorization - Number of Visits 12    Progress Note Due on Visit 10    PT Start Time 1147    PT Stop Time 1230    PT Time Calculation (min) 43 min    Activity Tolerance Patient tolerated treatment well    Behavior During Therapy WFL for tasks assessed/performed                Past Medical History:  Diagnosis Date   Arthritis    Headache    Lichen sclerosus    Neuromuscular disorder (HCC)    PONV (postoperative nausea and vomiting)    Past Surgical History:  Procedure Laterality Date   CATARACT EXTRACTION W/PHACO  08/15/2010   Procedure: CATARACT EXTRACTION PHACO AND INTRAOCULAR LENS PLACEMENT (IOC);  Surgeon: Gemma Payor;  Location: [facility];  Service: Ophthalmology;  Laterality: Left;  CDE 14.67   CATARACT EXTRACTION W/PHACO  08/25/2010   Procedure: CATARACT EXTRACTION PHACO AND INTRAOCULAR LENS PLACEMENT (IOC);  Surgeon: Gemma Payor;  Location: [facility];  Service: Ophthalmology;  Laterality: Right;  CDE 12.65   COLONOSCOPY  01/30/2011   Procedure: COLONOSCOPY;  Surgeon: Corbin Ade, MD;  Location: [facility];  Service: Endoscopy;  Laterality: N/A;  8:30 AM   DILATION AND CURETTAGE OF UTERUS     30+ yrs ago   EYE SURGERY     left KPE w/ IOL   TONSILLECTOMY     TUBAL LIGATION     Patient Active Problem List   Diagnosis Date Noted   Spondylolisthesis of lumbar region 05/03/2022   Superficial fungus infection of skin 03/03/2020   Vulvar itching 06/05/2016   Lichen sclerosus et atrophicus of the vulva 02/03/2016    PCP: Carylon Perches MD  REFERRING  PROVIDER: Floreen Comber, NP   REFERRING DIAG: [phone] (ICD-10-CM) - Spondylosis without myelopathy or radiculopathy, lumbar region   Rationale for Evaluation and Treatment: Rehabilitation  THERAPY DIAG:  Spondylosis of lumbar region without myelopathy or radiculopathy  Muscle weakness (generalized)  ONSET DATE: 05/03/2022  SUBJECTIVE:  SUBJECTIVE STATEMENT: A little bit more painful than usual and cannot attribute why. Pt reporting increased core activation throughout. Reporting that hips are more sore this session.  PERTINENT HISTORY:  Posterior Fusion   PAIN:  Are you having pain? Yes: NPRS scale: 4/10 Pain location: Bilateral SIJ and spans out laterally Pain description: Achy, sharp, tingles and just "hurts" its just annoying Aggravating factors: long term functional activities Relieving factors: "nothing"  PRECAUTIONS: None  RED FLAGS: None   WEIGHT BEARING RESTRICTIONS: No  FALLS:  Has patient fallen in last 6 months? No  LIVING ENVIRONMENT: Lives with: lives alone Lives in: House/apartment Stairs: Yes: Internal: 1 flight steps; on left going up and External: 2 steps; on left going up Has following equipment at home: Walker - 2 wheeled and bed side commode  OCCUPATION: Retired; cares for grandchild  PLOF: Independent  PATIENT GOALS: "bend over without hurting"  NEXT MD VISIT: Next Month  OBJECTIVE:  Note: Objective measures were completed at Evaluation unless otherwise noted.  DIAGNOSTIC FINDINGS:    PATIENT SURVEYS:  Modified Oswestry 24/50    COGNITION: Overall cognitive status: Within functional limits for tasks assessed     SENSATION: WFL   POSTURE:   PALPATION: Increased tension through lumbar paraspinals  LUMBAR ROM:   AROM eval  Flexion 70%   Extension 10%  Right lateral flexion 20%  Left lateral flexion 20%  Right rotation   Left rotation    (Blank rows = not tested)  LOWER EXTREMITY ROM:     Active  Right eval Left eval  Hip flexion    Hip extension    Hip abduction    Hip adduction    Hip internal rotation    Hip external rotation    Knee flexion    Knee extension    Ankle dorsiflexion    Ankle plantarflexion    Ankle inversion    Ankle eversion     (Blank rows = not tested)  LOWER EXTREMITY MMT:    MMT Right eval Left eval  Hip flexion 3- 3+  Hip extension 3 3  Hip abduction 3+ 3+  Hip adduction    Hip internal rotation    Hip external rotation    Knee flexion    Knee extension 4- 4-  Ankle dorsiflexion    Ankle plantarflexion    Ankle inversion    Ankle eversion     (Blank rows = not tested)    FUNCTIONAL TESTS:  30 seconds chair stand test:9x 2 minute walk test: 327ft  GAIT: Distance walked: 310 feet Assistive device utilized: None Level of assistance: Complete Independence Comments: Left lean in trunk with bilateral trendelenberg, R shoulder elevated during ambulation.    TODAY'S TREATMENT:                                                                                                                              DATE:  11/22/2022  -Standing hip abduction with  RTB @ knees 2 x 12 -Standing hip extension with RTB at knees 1 x 10 bilaterally -Standing anti rotation chest press YTB 2 x 7 with 3' hold -Standing elevated bird-dog on counter x 10 bilaterally -ADL- practiced weighted lifting, mimicking deadlift from elevated object with 2lb bar and 5lb ankle weight 2 sets of 3 with tactile cues for hip hinge and proper timing of hips and knee arch of motin.   11/20/2022  -Lateral seated flexion theraball sitting x5 bilaterally with 3-5' hold -Supine bridges 15 with 3' isometric- cues for posterior pelvic tilt with good carryover -Isometric hooklying hip flexion x 10 with 3' hold -Side  stepping with RTB around knees 56ft x 2 with hip hinge and bent knee.  -ADL activity with form modification  to promote reduce LB burden and tolerance. Performing raking and piling leaves in box with moving about 20-95ft with cues for core activation and practice with improve base of support around object to lift.   11/16/2022  -30 second chair test: 9x -10x wall reaches for lumbar extension and lumbar elongatoin.  -Forward flexion theraball sitting x 5 -Lateral seated flexion theraball sitting x5 bilaterally with 3-5' hold -LTR in supine x30 -Clamshells without resistance x 15 bilaterally  -throughout bed level TE, education and tactile cuing provided for bed mobility with log rolling.   11/15/2022 PT Evaluation, findings, prognosis, frequency, attendance policy, and HEP if given.      PATIENT EDUCATION:  Education details: PT Evaluation, findings, prognosis, frequency, attendance policy, and HEP if given.  Person educated: Patient Education method: Medical illustrator Education comprehension: verbalized understanding  HOME EXERCISE PROGRAM: Access Code: [email or web address] URL: [email or web address] Date: 11/16/2022 Prepared by: Starling Manns  Exercises - Supine Lower Trunk Rotation  - 1 x daily - 7 x weekly - 3 sets - 10 reps - Clamshell  - 1 x daily - 7 x weekly - 3 sets - 10 reps - Supine Bridge  - 1 x daily - 7 x weekly - 3 sets - 10 reps  ASSESSMENT:  CLINICAL IMPRESSION: Pt tolerating session well today with continual progress with hip strengthening. Good response with HEP for reported muscle soreness. Pt continues to be limited with ADLs  due to pain and continual ROM restrictions. Pt showing poor core control during thereapeutic exercises due to core weakness, which continues to promote core instability and continual pain. Practiced formal lifting from ground with intention for gluteal strengthening, good carryover with verbal cues and tactile cues with  reduced arc of motion. Pt will continue to benefit from skilled PT services to address funcitonal deficits, pain, in order to improve quality of life.   OBJECTIVE IMPAIRMENTS: Abnormal gait, decreased balance, decreased coordination, decreased mobility, decreased ROM, decreased strength, impaired flexibility, and pain.   ACTIVITY LIMITATIONS: carrying, lifting, bending, sitting, standing, squatting, sleeping, transfers, and locomotion level  PARTICIPATION LIMITATIONS: cleaning, laundry, shopping, community activity, occupation, and yard work  PERSONAL FACTORS: Age are also affecting patient's functional outcome.   REHAB POTENTIAL: Good  CLINICAL DECISION MAKING: Stable/uncomplicated  EVALUATION COMPLEXITY: Low   GOALS: Goals reviewed with patient? No  SHORT TERM GOALS: Target date: 12/14/18  Pt will be independent with HEP in order to demonstrate participation in Physical Therapy POC.  Baseline: Goal status: INITIAL  2.  Pt will report 3/10 pain with mobility in order to demonstrate improved pain with functional activities.  Baseline: 7/10 worst Goal status: INITIAL  LONG TERM GOALS: Target date: 01/11/2023  Pt will improve 30 second chair  test by [facility] in order to demonstrate improved functional strength to return to desired activities.  Baseline: next session Goal status: INITIAL  2.  Pt will improve bilateral hip MMT by > 1/2 grade in order to improve pain tolerance during community mobility activities. .  Baseline: see MMT Goal status: INITIAL  3.  Pt will improve Modified Oswestry score by > 5 points in order to demonstrate improved pain with functional goals and outcomes. Baseline: see objective Goal status: INITIAL  4.  Pt will report 2/10 pain with mobility in order to demonstrate reduced pain with overall functional status.  Baseline: see objective Goal status: INITIAL  PLAN:  PT FREQUENCY: 2x/week  PT DURATION: 8 weeks  PLANNED INTERVENTIONS: 97164- PT  Re-evaluation, 97110-Therapeutic exercises, 97530- Therapeutic activity, 97112- Neuromuscular re-education, 97535- Self Care, 52841- Manual therapy, [phone]- Gait training, Balance training, and Stair training.  PLAN FOR NEXT SESSION: core stability, BLE strengthening  Nelida Meuse PT, DPT Physical Therapist with Tomasa Hosteller [facility] [phone] office   Nelida Meuse, PT 11/22/2022, 12:32 PM

## 2022-11-23 ENCOUNTER — Ambulatory Visit (HOSPITAL_COMMUNITY): Payer: Medicare PPO

## 2022-11-27 ENCOUNTER — Ambulatory Visit (HOSPITAL_COMMUNITY): Payer: Medicare PPO | Attending: Student

## 2022-11-27 ENCOUNTER — Encounter (HOSPITAL_COMMUNITY): Payer: Self-pay

## 2022-11-27 DIAGNOSIS — M6281 Muscle weakness (generalized): Secondary | ICD-10-CM | POA: Insufficient documentation

## 2022-11-27 DIAGNOSIS — M47816 Spondylosis without myelopathy or radiculopathy, lumbar region: Secondary | ICD-10-CM | POA: Diagnosis not present

## 2022-11-27 NOTE — Therapy (Signed)
OUTPATIENT PHYSICAL THERAPY THORACOLUMBAR TREATMENT   Patient Name: Casey Armstrong MRN: [id number] DOB:11-Jun-1945, 77 y.o., female Today's Date: 11/27/2022  END OF SESSION:  PT End of Session - 11/27/22 1342     Visit Number 5    Number of Visits 12    Date for PT Re-Evaluation 01/13/23    Authorization Type [employer]    Authorization Time Period 12v-pending date -01/13/23    Authorization - Visit Number 4    Authorization - Number of Visits 12    Progress Note Due on Visit 10    PT Start Time 1301    PT Stop Time 1341    PT Time Calculation (min) 40 min    Activity Tolerance Patient tolerated treatment well    Behavior During Therapy [facility] for tasks assessed/performed              Past Medical History:  Diagnosis Date   Arthritis    Headache    Lichen sclerosus    Neuromuscular disorder (HCC)    PONV (postoperative nausea and vomiting)    Past Surgical History:  Procedure Laterality Date   CATARACT EXTRACTION W/PHACO  08/15/2010   Procedure: CATARACT EXTRACTION PHACO AND INTRAOCULAR LENS PLACEMENT (IOC);  Surgeon: Gemma Payor;  Location: [facility];  Service: Ophthalmology;  Laterality: Left;  CDE 14.67   CATARACT EXTRACTION W/PHACO  08/25/2010   Procedure: CATARACT EXTRACTION PHACO AND INTRAOCULAR LENS PLACEMENT (IOC);  Surgeon: Gemma Payor;  Location: [facility];  Service: Ophthalmology;  Laterality: Right;  CDE 12.65   COLONOSCOPY  01/30/2011   Procedure: COLONOSCOPY;  Surgeon: Corbin Ade, MD;  Location: [facility];  Service: Endoscopy;  Laterality: N/A;  8:30 AM   DILATION AND CURETTAGE OF UTERUS     30+ yrs ago   EYE SURGERY     left KPE w/ IOL   TONSILLECTOMY     TUBAL LIGATION     Patient Active Problem List   Diagnosis Date Noted   Spondylolisthesis of lumbar region 05/03/2022   Superficial fungus infection of skin 03/03/2020   Vulvar itching 06/05/2016   Lichen sclerosus et atrophicus of the vulva 02/03/2016    PCP: Carylon Perches MD  REFERRING  PROVIDER: Floreen Comber, NP   REFERRING DIAG: [phone] (ICD-10-CM) - Spondylosis without myelopathy or radiculopathy, lumbar region   Rationale for Evaluation and Treatment: Rehabilitation  THERAPY DIAG:  Muscle weakness (generalized)  ONSET DATE: 05/03/2022  SUBJECTIVE:  SUBJECTIVE STATEMENT:  Pt reports busy weekend with daughter and was reporting 8/10 pain and took tylenol due to busy weekend. 4/10 today entering therapy.   PERTINENT HISTORY:  Posterior Fusion   PAIN:  Are you having pain? Yes: NPRS scale: 4/10 Pain location: Bilateral SIJ and spans out laterally Pain description: Achy, sharp, tingles and just "hurts" its just annoying Aggravating factors: long term functional activities Relieving factors: "nothing"  PRECAUTIONS: None  RED FLAGS: None   WEIGHT BEARING RESTRICTIONS: No  FALLS:  Has patient fallen in last 6 months? No  LIVING ENVIRONMENT: Lives with: lives alone Lives in: House/apartment Stairs: Yes: Internal: 1 flight steps; on left going up and External: 2 steps; on left going up Has following equipment at home: Walker - 2 wheeled and bed side commode  OCCUPATION: Retired; cares for grandchild  PLOF: Independent  PATIENT GOALS: "bend over without hurting"  NEXT MD VISIT: Next Month  OBJECTIVE:  Note: Objective measures were completed at Evaluation unless otherwise noted.  DIAGNOSTIC FINDINGS:    PATIENT SURVEYS:  Modified Oswestry 24/50    COGNITION: Overall cognitive status: Within functional limits for tasks assessed     SENSATION: WFL   POSTURE:   PALPATION: Increased tension through lumbar paraspinals  LUMBAR ROM:   AROM eval  Flexion 70%  Extension 10%  Right lateral flexion 20%  Left lateral flexion 20%  Right rotation   Left  rotation    (Blank rows = not tested)  LOWER EXTREMITY ROM:     Active  Right eval Left eval  Hip flexion    Hip extension    Hip abduction    Hip adduction    Hip internal rotation    Hip external rotation    Knee flexion    Knee extension    Ankle dorsiflexion    Ankle plantarflexion    Ankle inversion    Ankle eversion     (Blank rows = not tested)  LOWER EXTREMITY MMT:    MMT Right eval Left eval  Hip flexion 3- 3+  Hip extension 3 3  Hip abduction 3+ 3+  Hip adduction    Hip internal rotation    Hip external rotation    Knee flexion    Knee extension 4- 4-  Ankle dorsiflexion    Ankle plantarflexion    Ankle inversion    Ankle eversion     (Blank rows = not tested)    FUNCTIONAL TESTS:  30 seconds chair stand test:9x 2 minute walk test: 319ft  GAIT: Distance walked: 310 feet Assistive device utilized: None Level of assistance: Complete Independence Comments: Left lean in trunk with bilateral trendelenberg, R shoulder elevated during ambulation.    TODAY'S TREATMENT:                                                                                                                              DATE:  11/27/2022  -Nustep 7 seat and arms  x 8 minutes -Standing // bars bird dogs 3x 30' -Standing hip abduction with GTB 1 x 15 -RTB standing anti-rotation chest press 2 x 7 with 4' count -ADL- practiced weighted lifting, mimicking deadlift from elevated object with 2lb bar and 6lb ankle weights 2 sets of 5 with tactile cues for hip hinge and proper timing of hips and knee arch of motion. Tactile cues at ASIS.  -Tandem stance without UE support 2 x 15 bilaterally  11/22/2022  -Standing hip abduction with RTB @ knees 2 x 12 -Standing hip extension with RTB at knees 1 x 10 bilaterally -Standing anti rotation chest press YTB 2 x 7 with 3' hold -Standing elevated bird-dog on counter x 10 bilaterally -ADL- practiced weighted lifting, mimicking deadlift from  elevated object with 2lb bar and 5lb ankle weight 2 sets of 3 with tactile cues for hip hinge and proper timing of hips and knee arch of motin.   11/20/2022  -Lateral seated flexion theraball sitting x5 bilaterally with 3-5' hold -Supine bridges 15 with 3' isometric- cues for posterior pelvic tilt with good carryover -Isometric hooklying hip flexion x 10 with 3' hold -Side stepping with RTB around knees 36ft x 2 with hip hinge and bent knee.  -ADL activity with form modification  to promote reduce LB burden and tolerance. Performing raking and piling leaves in box with moving about 20-41ft with cues for core activation and practice with improve base of support around object to lift.   PATIENT EDUCATION:  Education details: PT Evaluation, findings, prognosis, frequency, attendance policy, and HEP if given.  Person educated: Patient Education method: Medical illustrator Education comprehension: verbalized understanding  HOME EXERCISE PROGRAM: Access Code: [email or web address] URL: [email or web address] Date: 11/16/2022 Prepared by: Starling Manns  Exercises - Supine Lower Trunk Rotation  - 1 x daily - 7 x weekly - 3 sets - 10 reps - Clamshell  - 1 x daily - 7 x weekly - 3 sets - 10 reps - Supine Bridge  - 1 x daily - 7 x weekly - 3 sets - 10 reps  ASSESSMENT:  CLINICAL IMPRESSION: Pt tolerating session well. Progressing interventions with further anti-rotation movement. EOS pain is 6/10, pt reporting anti-rotation exercises is agitating exercises, reduce intensity and promote further core stability with anti-rotation further to continue to practice this skill for core stability. Continue to increase capacity for functional lifting skill for posterior chain strengthening. Pt will continue to benefit from skilled PT services to address funcitonal deficits, pain, in order to improve quality of life.   OBJECTIVE IMPAIRMENTS: Abnormal gait, decreased balance, decreased  coordination, decreased mobility, decreased ROM, decreased strength, impaired flexibility, and pain.   ACTIVITY LIMITATIONS: carrying, lifting, bending, sitting, standing, squatting, sleeping, transfers, and locomotion level  PARTICIPATION LIMITATIONS: cleaning, laundry, shopping, community activity, occupation, and yard work  PERSONAL FACTORS: Age are also affecting patient's functional outcome.   REHAB POTENTIAL: Good  CLINICAL DECISION MAKING: Stable/uncomplicated  EVALUATION COMPLEXITY: Low   GOALS: Goals reviewed with patient? No  SHORT TERM GOALS: Target date: 12/14/18  Pt will be independent with HEP in order to demonstrate participation in Physical Therapy POC.  Baseline: Goal status: INITIAL  2.  Pt will report 3/10 pain with mobility in order to demonstrate improved pain with functional activities.  Baseline: 7/10 worst Goal status: INITIAL  LONG TERM GOALS: Target date: 01/11/2023  Pt will improve 30 second chair test by MC[facility] in order to demonstrate improved functional strength to return to desired activities.  Baseline: next  session Goal status: INITIAL  2.  Pt will improve bilateral hip MMT by > 1/2 grade in order to improve pain tolerance during community mobility activities. .  Baseline: see MMT Goal status: INITIAL  3.  Pt will improve Modified Oswestry score by > 5 points in order to demonstrate improved pain with functional goals and outcomes. Baseline: see objective Goal status: INITIAL  4.  Pt will report 2/10 pain with mobility in order to demonstrate reduced pain with overall functional status.  Baseline: see objective Goal status: INITIAL  PLAN:  PT FREQUENCY: 2x/week  PT DURATION: 8 weeks  PLANNED INTERVENTIONS: 97164- PT Re-evaluation, 97110-Therapeutic exercises, 97530- Therapeutic activity, 97112- Neuromuscular re-education, 97535- Self Care, 70623- Manual therapy, [phone]- Gait training, Balance training, and Stair training.  PLAN FOR  NEXT SESSION: core stability, BLE strengthening  Nelida Meuse PT, DPT Physical Therapist with Tomasa Hosteller [facility] [phone] office   Nelida Meuse, PT 11/27/2022, 1:43 PM

## 2022-11-29 ENCOUNTER — Encounter (HOSPITAL_COMMUNITY): Payer: Self-pay

## 2022-11-29 ENCOUNTER — Ambulatory Visit (HOSPITAL_COMMUNITY): Payer: Medicare PPO

## 2022-11-29 DIAGNOSIS — M47816 Spondylosis without myelopathy or radiculopathy, lumbar region: Secondary | ICD-10-CM

## 2022-11-29 DIAGNOSIS — M6281 Muscle weakness (generalized): Secondary | ICD-10-CM

## 2022-11-29 NOTE — Therapy (Signed)
OUTPATIENT PHYSICAL THERAPY THORACOLUMBAR TREATMENT   Patient Name: Casey Armstrong MRN: [id number] DOB:[birth year], 77 y.o., female Today's Date: 11/29/2022  END OF SESSION:  PT End of Session - 11/29/22 1349     Visit Number 6    Number of Visits 12    Date for PT Re-Evaluation 01/13/23    Authorization Type [employer]    Authorization Time Period 12v-pending date -01/13/23    Authorization - Visit Number 5    Authorization - Number of Visits 12    Progress Note Due on Visit 10    PT Start Time 1303    PT Stop Time 1344    PT Time Calculation (min) 41 min    Activity Tolerance Patient tolerated treatment well    Behavior During Therapy WFL for tasks assessed/performed               Past Medical History:  Diagnosis Date   Arthritis    Headache    Lichen sclerosus    Neuromuscular disorder (HCC)    PONV (postoperative nausea and vomiting)    Past Surgical History:  Procedure Laterality Date   CATARACT EXTRACTION W/PHACO  08/15/2010   Procedure: CATARACT EXTRACTION PHACO AND INTRAOCULAR LENS PLACEMENT (IOC);  Surgeon: Gemma Payor;  Location: [facility];  Service: Ophthalmology;  Laterality: Left;  CDE 14.67   CATARACT EXTRACTION W/PHACO  08/25/2010   Procedure: CATARACT EXTRACTION PHACO AND INTRAOCULAR LENS PLACEMENT (IOC);  Surgeon: Gemma Payor;  Location: [facility];  Service: Ophthalmology;  Laterality: Right;  CDE 12.65   COLONOSCOPY  01/30/2011   Procedure: COLONOSCOPY;  Surgeon: Corbin Ade, MD;  Location: [facility];  Service: Endoscopy;  Laterality: N/A;  8:30 AM   DILATION AND CURETTAGE OF UTERUS     30+ yrs ago   EYE SURGERY     left KPE w/ IOL   TONSILLECTOMY     TUBAL LIGATION     Patient Active Problem List   Diagnosis Date Noted   Spondylolisthesis of lumbar region 05/03/2022   Superficial fungus infection of skin 03/03/2020   Vulvar itching 06/05/2016   Lichen sclerosus et atrophicus of the vulva 02/03/2016    PCP: Carylon Perches MD  REFERRING  PROVIDER: Floreen Comber, NP   REFERRING DIAG: [phone] (ICD-10-CM) - Spondylosis without myelopathy or radiculopathy, lumbar region   Rationale for Evaluation and Treatment: Rehabilitation  THERAPY DIAG:  Muscle weakness (generalized)  Spondylosis of lumbar region without myelopathy or radiculopathy  ONSET DATE: 05/03/2022  SUBJECTIVE:  SUBJECTIVE STATEMENT: Pt stated she is feeling better today, pain scale 2-3/10 at entrance.  Stated she feels her balance most challenging, no reports of recent fall.  PERTINENT HISTORY:  Posterior Fusion   PAIN:  Are you having pain? Yes: NPRS scale: 4/10 Pain location: Bilateral SIJ and spans out laterally Pain description: Achy, sharp, tingles and just "hurts" its just annoying Aggravating factors: long term functional activities Relieving factors: "nothing"  PRECAUTIONS: None  RED FLAGS: None   WEIGHT BEARING RESTRICTIONS: No  FALLS:  Has patient fallen in last 6 months? No  LIVING ENVIRONMENT: Lives with: lives alone Lives in: House/apartment Stairs: Yes: Internal: 1 flight steps; on left going up and External: 2 steps; on left going up Has following equipment at home: Walker - 2 wheeled and bed side commode  OCCUPATION: Retired; cares for grandchild  PLOF: Independent  PATIENT GOALS: "bend over without hurting"  NEXT MD VISIT: Next Month  OBJECTIVE:  Note: Objective measures were completed at Evaluation unless otherwise noted.  DIAGNOSTIC FINDINGS:    PATIENT SURVEYS:  Modified Oswestry 24/50    COGNITION: Overall cognitive status: Within functional limits for tasks assessed     SENSATION: WFL   POSTURE:   PALPATION: Increased tension through lumbar paraspinals  LUMBAR ROM:   AROM eval  Flexion 70%  Extension 10%   Right lateral flexion 20%  Left lateral flexion 20%  Right rotation   Left rotation    (Blank rows = not tested)  LOWER EXTREMITY ROM:     Active  Right eval Left eval  Hip flexion    Hip extension    Hip abduction    Hip adduction    Hip internal rotation    Hip external rotation    Knee flexion    Knee extension    Ankle dorsiflexion    Ankle plantarflexion    Ankle inversion    Ankle eversion     (Blank rows = not tested)  LOWER EXTREMITY MMT:    MMT Right eval Left eval  Hip flexion 3- 3+  Hip extension 3 3  Hip abduction 3+ 3+  Hip adduction    Hip internal rotation    Hip external rotation    Knee flexion    Knee extension 4- 4-  Ankle dorsiflexion    Ankle plantarflexion    Ankle inversion    Ankle eversion     (Blank rows = not tested)    FUNCTIONAL TESTS:  30 seconds chair stand test:9x 2 minute walk test: 358ft  GAIT: Distance walked: 310 feet Assistive device utilized: None Level of assistance: Complete Independence Comments: Left lean in trunk with bilateral trendelenberg, R shoulder elevated during ambulation.    TODAY'S TREATMENT:                                                                                                                              DATE:  11/29/22: -Nustep 7 seat and arms  x 5 minutes [place] L3 -GTB shoulder extension -GTB sholder extension hold with marching alternating 10x 3" therapist min A for safety -GTB rows -GTB standing anti-rotation chest press in partial tandem stance 4x 10 -SLS Lt 3" Rt 3" max of 5 -Tandem stance 2x 15" with intermittent HHA required -Vector stance 3x5" with UE support  11/27/2022  -Nustep 7 seat and arms x 8 minutes -Standing // bars bird dogs 3x 30' -Standing hip abduction with GTB 1 x 15 -RTB standing anti-rotation chest press 2 x 7 with 4' count -ADL- practiced weighted lifting, mimicking deadlift from elevated object with 2lb bar and 6lb ankle weights 2 sets of 5 with  tactile cues for hip hinge and proper timing of hips and knee arch of motion. Tactile cues at ASIS.  -Tandem stance without UE support 2 x 15 bilaterally  11/22/2022  -Standing hip abduction with RTB @ knees 2 x 12 -Standing hip extension with RTB at knees 1 x 10 bilaterally -Standing anti rotation chest press YTB 2 x 7 with 3' hold -Standing elevated bird-dog on counter x 10 bilaterally -ADL- practiced weighted lifting, mimicking deadlift from elevated object with 2lb bar and 5lb ankle weight 2 sets of 3 with tactile cues for hip hinge and proper timing of hips and knee arch of motin.   11/20/2022  -Lateral seated flexion theraball sitting x5 bilaterally with 3-5' hold -Supine bridges 15 with 3' isometric- cues for posterior pelvic tilt with good carryover -Isometric hooklying hip flexion x 10 with 3' hold -Side stepping with RTB around knees 70ft x 2 with hip hinge and bent knee.  -ADL activity with form modification  to promote reduce LB burden and tolerance. Performing raking and piling leaves in box with moving about 20-47ft with cues for core activation and practice with improve base of support around object to lift.   PATIENT EDUCATION:  Education details: PT Evaluation, findings, prognosis, frequency, attendance policy, and HEP if given.  Person educated: Patient Education method: Medical illustrator Education comprehension: verbalized understanding  HOME EXERCISE PROGRAM: Access Code: [email or web address] URL: [email or web address] Date: 11/16/2022 Prepared by: Starling Manns  Exercises - Supine Lower Trunk Rotation  - 1 x daily - 7 x weekly - 3 sets - 10 reps - Clamshell  - 1 x daily - 7 x weekly - 3 sets - 10 reps - Supine Bridge  - 1 x daily - 7 x weekly - 3 sets - 10 reps  - Single Leg Stance  - 1-2 x daily - 7 x weekly - 1 sets - 5 reps - 30" hold - Standing Tandem Balance with Counter Support  - 1 x daily - 7 x weekly - 1 sets - 3 reps - 30"  hold ASSESSMENT:  CLINICAL IMPRESSION: Session focus with core and proximal strengthening, incorporated balance activities during exercises with min guard for safety.  Added balance activities to HEP, encouraged to complete near sink/counter for safety with verbalized understanding.  Continued to educate proper mechanics with functional lifting for posterior chain strengthening.  Pt tolerated well with session, no reports of increased or decreased pain at EOS.  Pt tolerating session well. Progressing interventions with further anti-rotation movement. EOS pain is 6/10, pt reporting anti-rotation exercises is agitating exercises, reduce intensity and promote further core stability with anti-rotation further to continue to practice this skill for core stability. Continue to increase capacity for functional lifting skill for posterior chain strengthening. Pt will continue to benefit from skilled PT services to address  funcitonal deficits, pain, in order to improve quality of life.   OBJECTIVE IMPAIRMENTS: Abnormal gait, decreased balance, decreased coordination, decreased mobility, decreased ROM, decreased strength, impaired flexibility, and pain.   ACTIVITY LIMITATIONS: carrying, lifting, bending, sitting, standing, squatting, sleeping, transfers, and locomotion level  PARTICIPATION LIMITATIONS: cleaning, laundry, shopping, community activity, occupation, and yard work  PERSONAL FACTORS: Age are also affecting patient's functional outcome.   REHAB POTENTIAL: Good  CLINICAL DECISION MAKING: Stable/uncomplicated  EVALUATION COMPLEXITY: Low   GOALS: Goals reviewed with patient? No  SHORT TERM GOALS: Target date: 12/14/18  Pt will be independent with HEP in order to demonstrate participation in Physical Therapy POC.  Baseline: Goal status: INITIAL  2.  Pt will report 3/10 pain with mobility in order to demonstrate improved pain with functional activities.  Baseline: 7/10 worst Goal status:  INITIAL  LONG TERM GOALS: Target date: 01/11/2023  Pt will improve 30 second chair test by MCID in order to demonstrate improved functional strength to return to desired activities.  Baseline: next session Goal status: INITIAL  2.  Pt will improve bilateral hip MMT by > 1/2 grade in order to improve pain tolerance during community mobility activities. .  Baseline: see MMT Goal status: INITIAL  3.  Pt will improve Modified Oswestry score by > 5 points in order to demonstrate improved pain with functional goals and outcomes. Baseline: see objective Goal status: INITIAL  4.  Pt will report 2/10 pain with mobility in order to demonstrate reduced pain with overall functional status.  Baseline: see objective Goal status: INITIAL  PLAN:  PT FREQUENCY: 2x/week  PT DURATION: 8 weeks  PLANNED INTERVENTIONS: 97164- PT Re-evaluation, 97110-Therapeutic exercises, 97530- Therapeutic activity, 97112- Neuromuscular re-education, 97535- Self Care, 62130- Manual therapy, [phone]- Gait training, Balance training, and Stair training.  PLAN FOR NEXT SESSION: core stability, BLE strengthening  Becky Sax, LPTA/CLT; CBIS [phone]  Juel Burrow, PTA 11/29/2022, 4:35 PM

## 2022-11-30 DIAGNOSIS — D225 Melanocytic nevi of trunk: Secondary | ICD-10-CM | POA: Diagnosis not present

## 2022-11-30 DIAGNOSIS — L728 Other follicular cysts of the skin and subcutaneous tissue: Secondary | ICD-10-CM | POA: Diagnosis not present

## 2022-11-30 DIAGNOSIS — Z1283 Encounter for screening for malignant neoplasm of skin: Secondary | ICD-10-CM | POA: Diagnosis not present

## 2022-11-30 DIAGNOSIS — D485 Neoplasm of uncertain behavior of skin: Secondary | ICD-10-CM | POA: Diagnosis not present

## 2022-12-04 ENCOUNTER — Ambulatory Visit (HOSPITAL_COMMUNITY): Payer: Medicare PPO

## 2022-12-04 ENCOUNTER — Encounter (HOSPITAL_COMMUNITY): Payer: Self-pay

## 2022-12-04 DIAGNOSIS — M47816 Spondylosis without myelopathy or radiculopathy, lumbar region: Secondary | ICD-10-CM | POA: Diagnosis not present

## 2022-12-04 DIAGNOSIS — M6281 Muscle weakness (generalized): Secondary | ICD-10-CM

## 2022-12-04 NOTE — Therapy (Signed)
OUTPATIENT PHYSICAL THERAPY THORACOLUMBAR TREATMENT   Patient Name: Casey Armstrong MRN: [id number] DOB:[birth year], 77 y.o., female Today's Date: 12/04/2022  END OF SESSION:  PT End of Session - 12/04/22 1509     Visit Number 7    Number of Visits 12    Date for PT Re-Evaluation 01/13/23    Authorization Type [employer]    Authorization Time Period 12v-pending date -01/13/23    Authorization - Visit Number 6    Authorization - Number of Visits 12    Progress Note Due on Visit 10    PT Start Time 1430    PT Stop Time 1509    PT Time Calculation (min) 39 min    Activity Tolerance Patient tolerated treatment well    Behavior During Therapy WFL for tasks assessed/performed                Past Medical History:  Diagnosis Date   Arthritis    Headache    Lichen sclerosus    Neuromuscular disorder (HCC)    PONV (postoperative nausea and vomiting)    Past Surgical History:  Procedure Laterality Date   CATARACT EXTRACTION W/PHACO  08/15/2010   Procedure: CATARACT EXTRACTION PHACO AND INTRAOCULAR LENS PLACEMENT (IOC);  Surgeon: Gemma Payor;  Location: [facility];  Service: Ophthalmology;  Laterality: Left;  CDE 14.67   CATARACT EXTRACTION W/PHACO  08/25/2010   Procedure: CATARACT EXTRACTION PHACO AND INTRAOCULAR LENS PLACEMENT (IOC);  Surgeon: Gemma Payor;  Location: [facility];  Service: Ophthalmology;  Laterality: Right;  CDE 12.65   COLONOSCOPY  01/30/2011   Procedure: COLONOSCOPY;  Surgeon: Corbin Ade, MD;  Location: [facility];  Service: Endoscopy;  Laterality: N/A;  8:30 AM   DILATION AND CURETTAGE OF UTERUS     30+ yrs ago   EYE SURGERY     left KPE w/ IOL   TONSILLECTOMY     TUBAL LIGATION     Patient Active Problem List   Diagnosis Date Noted   Spondylolisthesis of lumbar region 05/03/2022   Superficial fungus infection of skin 03/03/2020   Vulvar itching 06/05/2016   Lichen sclerosus et atrophicus of the vulva 02/03/2016    PCP: Carylon Perches MD  REFERRING  PROVIDER: Floreen Comber, NP   REFERRING DIAG: [phone] (ICD-10-CM) - Spondylosis without myelopathy or radiculopathy, lumbar region   Rationale for Evaluation and Treatment: Rehabilitation  THERAPY DIAG:  Muscle weakness (generalized)  Spondylosis of lumbar region without myelopathy or radiculopathy  ONSET DATE: 05/03/2022  SUBJECTIVE:  SUBJECTIVE STATEMENT: Pt reporting 4/10 pain today. Since last session on Wednesday till now, consistent back pain.Marland Kitchen  PERTINENT HISTORY:  Posterior Fusion   PAIN:  Are you having pain? Yes: NPRS scale: 4/10 Pain location: Bilateral SIJ and spans out laterally Pain description: Achy, sharp, tingles and just "hurts" its just annoying Aggravating factors: long term functional activities Relieving factors: "nothing"  PRECAUTIONS: None  RED FLAGS: None   WEIGHT BEARING RESTRICTIONS: No  FALLS:  Has patient fallen in last 6 months? No  LIVING ENVIRONMENT: Lives with: lives alone Lives in: House/apartment Stairs: Yes: Internal: 1 flight steps; on left going up and External: 2 steps; on left going up Has following equipment at home: Walker - 2 wheeled and bed side commode  OCCUPATION: Retired; cares for grandchild  PLOF: Independent  PATIENT GOALS: "bend over without hurting"  NEXT MD VISIT: Next Month  OBJECTIVE:  Note: Objective measures were completed at Evaluation unless otherwise noted.  DIAGNOSTIC FINDINGS:    PATIENT SURVEYS:  Modified Oswestry 24/50    COGNITION: Overall cognitive status: Within functional limits for tasks assessed     SENSATION: WFL   POSTURE:   PALPATION: Increased tension through lumbar paraspinals  LUMBAR ROM:   AROM eval  Flexion 70%  Extension 10%  Right lateral flexion 20%  Left lateral flexion 20%   Right rotation   Left rotation    (Blank rows = not tested)  LOWER EXTREMITY ROM:     Active  Right eval Left eval  Hip flexion    Hip extension    Hip abduction    Hip adduction    Hip internal rotation    Hip external rotation    Knee flexion    Knee extension    Ankle dorsiflexion    Ankle plantarflexion    Ankle inversion    Ankle eversion     (Blank rows = not tested)  LOWER EXTREMITY MMT:    MMT Right eval Left eval  Hip flexion 3- 3+  Hip extension 3 3  Hip abduction 3+ 3+  Hip adduction    Hip internal rotation    Hip external rotation    Knee flexion    Knee extension 4- 4-  Ankle dorsiflexion    Ankle plantarflexion    Ankle inversion    Ankle eversion     (Blank rows = not tested)    FUNCTIONAL TESTS:  30 seconds chair stand test:9x 2 minute walk test: 382ft  GAIT: Distance walked: 310 feet Assistive device utilized: None Level of assistance: Complete Independence Comments: Left lean in trunk with bilateral trendelenberg, R shoulder elevated during ambulation.    TODAY'S TREATMENT:                                                                                                                              DATE:  12/04/2022  -Nustep seat 6 x 5' -GTB shoulder extension hold with alternating marching 15 x  3 with min assist for safety -YTB Anti-rotation stepout 2 x 5 bilaterally; cues for slow, lateral step out while maintaining anti-rotation tension through band -Lateral ball rollouts 2x10' bilaterally -Standing hip abduction with GTB around knees 3 x 15 -Elevated deadlifting lifting mechanics x5 with 10lb weight, reducing tactile cues  11/29/22: -Nustep 7 seat and arms x 5 minutes [place] L3 -GTB shoulder extension -GTB sholder extension hold with marching alternating 10x 3" therapist min A for safety -GTB rows -GTB standing anti-rotation chest press in partial tandem stance 4x 10 -SLS Lt 3" Rt 3" max of 5 -Tandem stance 2x 15"  with intermittent HHA required -Vector stance 3x5" with UE support  11/27/2022  -Nustep 7 seat and arms x 8 minutes -Standing // bars bird dogs 3x 30' -Standing hip abduction with GTB 1 x 15 -RTB standing anti-rotation chest press 2 x 7 with 4' count -ADL- practiced weighted lifting, mimicking deadlift from elevated object with 2lb bar and 6lb ankle weights 2 sets of 5 with tactile cues for hip hinge and proper timing of hips and knee arch of motion. Tactile cues at ASIS.  -Tandem stance without UE support 2 x 15 bilaterally  PATIENT EDUCATION:  Education details: PT Evaluation, findings, prognosis, frequency, attendance policy, and HEP if given.  Person educated: Patient Education method: Medical illustrator Education comprehension: verbalized understanding  HOME EXERCISE PROGRAM: Access Code: [email or web address] URL: [email or web address] Date: 11/16/2022 Prepared by: Starling Manns  Exercises - Supine Lower Trunk Rotation  - 1 x daily - 7 x weekly - 3 sets - 10 reps - Clamshell  - 1 x daily - 7 x weekly - 3 sets - 10 reps - Supine Bridge  - 1 x daily - 7 x weekly - 3 sets - 10 reps  - Single Leg Stance  - 1-2 x daily - 7 x weekly - 1 sets - 5 reps - 30" hold - Standing Tandem Balance with Counter Support  - 1 x daily - 7 x weekly - 1 sets - 3 reps - 30" hold ASSESSMENT:  CLINICAL IMPRESSION: Pt tolerating session well, continuing to progress dynamic core stability, pt reporting increased muscle spasms with activity today but tolerated with dynamic stretching afterwards. Pt reporting no change in pain at EOS. Pt will benefit from skilled Physical Therapy services to address deficits/limitations in order to improve functional and QOL.   OBJECTIVE IMPAIRMENTS: Abnormal gait, decreased balance, decreased coordination, decreased mobility, decreased ROM, decreased strength, impaired flexibility, and pain.   ACTIVITY LIMITATIONS: carrying, lifting, bending, sitting,  standing, squatting, sleeping, transfers, and locomotion level  PARTICIPATION LIMITATIONS: cleaning, laundry, shopping, community activity, occupation, and yard work  PERSONAL FACTORS: Age are also affecting patient's functional outcome.   REHAB POTENTIAL: Good  CLINICAL DECISION MAKING: Stable/uncomplicated  EVALUATION COMPLEXITY: Low   GOALS: Goals reviewed with patient? No  SHORT TERM GOALS: Target date: 12/14/18  Pt will be independent with HEP in order to demonstrate participation in Physical Therapy POC.  Baseline: Goal status: INITIAL  2.  Pt will report 3/10 pain with mobility in order to demonstrate improved pain with functional activities.  Baseline: 7/10 worst Goal status: INITIAL  LONG TERM GOALS: Target date: 01/11/2023  Pt will improve 30 second chair test by MCID in order to demonstrate improved functional strength to return to desired activities.  Baseline: next session Goal status: INITIAL  2.  Pt will improve bilateral hip MMT by > 1/2 grade in order to improve pain tolerance during community mobility  activities. .  Baseline: see MMT Goal status: INITIAL  3.  Pt will improve Modified Oswestry score by > 5 points in order to demonstrate improved pain with functional goals and outcomes. Baseline: see objective Goal status: INITIAL  4.  Pt will report 2/10 pain with mobility in order to demonstrate reduced pain with overall functional status.  Baseline: see objective Goal status: INITIAL  PLAN:  PT FREQUENCY: 2x/week  PT DURATION: 8 weeks  PLANNED INTERVENTIONS: 97164- PT Re-evaluation, 97110-Therapeutic exercises, 97530- Therapeutic activity, 97112- Neuromuscular re-education, 97535- Self Care, 28413- Manual therapy, [phone]- Gait training, Balance training, and Stair training.  PLAN FOR NEXT SESSION: core stability, BLE strengthening  Nelida Meuse PT, DPT Physical Therapist with [facility] [phone]  office  Nelida Meuse, PT 12/04/2022, 3:10 PM

## 2022-12-06 ENCOUNTER — Encounter (HOSPITAL_COMMUNITY): Payer: Self-pay

## 2022-12-06 ENCOUNTER — Ambulatory Visit (HOSPITAL_COMMUNITY): Payer: Medicare PPO

## 2022-12-06 DIAGNOSIS — M47816 Spondylosis without myelopathy or radiculopathy, lumbar region: Secondary | ICD-10-CM

## 2022-12-06 DIAGNOSIS — M6281 Muscle weakness (generalized): Secondary | ICD-10-CM

## 2022-12-06 NOTE — Therapy (Signed)
OUTPATIENT PHYSICAL THERAPY THORACOLUMBAR TREATMENT   Patient Name: Casey Armstrong MRN: [id number] DOB:05-Apr-1945, 77 y.o., female Today's Date: 12/06/2022  END OF SESSION:  PT End of Session - 12/06/22 1341     Visit Number 8    Number of Visits 12    Date for PT Re-Evaluation 01/13/23    Authorization Type [employer]    Authorization Time Period 12v-pending date -01/13/23    Authorization - Visit Number 7    Authorization - Number of Visits 12    Progress Note Due on Visit 10    PT Start Time 1342    PT Stop Time 1430    PT Time Calculation (min) 48 min    Activity Tolerance Patient tolerated treatment well    Behavior During Therapy WFL for tasks assessed/performed                Past Medical History:  Diagnosis Date   Arthritis    Headache    Lichen sclerosus    Neuromuscular disorder (HCC)    PONV (postoperative nausea and vomiting)    Past Surgical History:  Procedure Laterality Date   CATARACT EXTRACTION W/PHACO  08/15/2010   Procedure: CATARACT EXTRACTION PHACO AND INTRAOCULAR LENS PLACEMENT (IOC);  Surgeon: Gemma Payor;  Location: [facility];  Service: Ophthalmology;  Laterality: Left;  CDE 14.67   CATARACT EXTRACTION W/PHACO  08/25/2010   Procedure: CATARACT EXTRACTION PHACO AND INTRAOCULAR LENS PLACEMENT (IOC);  Surgeon: Gemma Payor;  Location: [facility];  Service: Ophthalmology;  Laterality: Right;  CDE 12.65   COLONOSCOPY  01/30/2011   Procedure: COLONOSCOPY;  Surgeon: Corbin Ade, MD;  Location: [facility];  Service: Endoscopy;  Laterality: N/A;  8:30 AM   DILATION AND CURETTAGE OF UTERUS     30+ yrs ago   EYE SURGERY     left KPE w/ IOL   TONSILLECTOMY     TUBAL LIGATION     Patient Active Problem List   Diagnosis Date Noted   Spondylolisthesis of lumbar region 05/03/2022   Superficial fungus infection of skin 03/03/2020   Vulvar itching 06/05/2016   Lichen sclerosus et atrophicus of the vulva 02/03/2016    PCP: Carylon Perches MD  REFERRING  PROVIDER: Floreen Comber, NP   REFERRING DIAG: [phone] (ICD-10-CM) - Spondylosis without myelopathy or radiculopathy, lumbar region   Rationale for Evaluation and Treatment: Rehabilitation  THERAPY DIAG:  Muscle weakness (generalized)  Spondylosis of lumbar region without myelopathy or radiculopathy  ONSET DATE: 05/03/2022  Next apt date: 12/14/22  SUBJECTIVE:  SUBJECTIVE STATEMENT: Reports she is sore following apt on Monday, pain scale 5/10 today.  Reports difficulty lifting items off bathroom floor.    PERTINENT HISTORY:  Posterior Fusion   PAIN:  Are you having pain? Yes: NPRS scale: 4/10 Pain location: Bilateral SIJ and spans out laterally Pain description: Achy, sharp, tingles and just "hurts" its just annoying Aggravating factors: long term functional activities Relieving factors: "nothing"  PRECAUTIONS: None  RED FLAGS: None   WEIGHT BEARING RESTRICTIONS: No  FALLS:  Has patient fallen in last 6 months? No  LIVING ENVIRONMENT: Lives with: lives alone Lives in: House/apartment Stairs: Yes: Internal: 1 flight steps; on left going up and External: 2 steps; on left going up Has following equipment at home: Walker - 2 wheeled and bed side commode  OCCUPATION: Retired; cares for grandchild  PLOF: Independent  PATIENT GOALS: "bend over without hurting"  NEXT MD VISIT: Next Month  OBJECTIVE:  Note: Objective measures were completed at Evaluation unless otherwise noted.  DIAGNOSTIC FINDINGS:    PATIENT SURVEYS:  Modified Oswestry 24/50    COGNITION: Overall cognitive status: Within functional limits for tasks assessed     SENSATION: WFL   POSTURE:   PALPATION: Increased tension through lumbar paraspinals  LUMBAR ROM:   AROM eval  Flexion 70%  Extension  10%  Right lateral flexion 20%  Left lateral flexion 20%  Right rotation   Left rotation    (Blank rows = not tested)  LOWER EXTREMITY ROM:     Active  Right eval Left eval  Hip flexion    Hip extension    Hip abduction    Hip adduction    Hip internal rotation    Hip external rotation    Knee flexion    Knee extension    Ankle dorsiflexion    Ankle plantarflexion    Ankle inversion    Ankle eversion     (Blank rows = not tested)  LOWER EXTREMITY MMT:    MMT Right eval Left eval  Hip flexion 3- 3+  Hip extension 3 3  Hip abduction 3+ 3+  Hip adduction    Hip internal rotation    Hip external rotation    Knee flexion    Knee extension 4- 4-  Ankle dorsiflexion    Ankle plantarflexion    Ankle inversion    Ankle eversion     (Blank rows = not tested)    FUNCTIONAL TESTS:  30 seconds chair stand test:9x 2 minute walk test: 31ft  GAIT: Distance walked: 310 feet Assistive device utilized: None Level of assistance: Complete Independence Comments: Left lean in trunk with bilateral trendelenberg, R shoulder elevated during ambulation.    TODAY'S TREATMENT:                                                                                                                              DATE:  12/06/22: -Nustep 7 seat and arms x 5 minutes  [place] L3 - SLS: Lt 4", Rt 4" -Sidestep with RTB around thigh -Deadlifting mechanics 10x -18in box lifting yellow weighted ball (3.6#) 2x 5 -GTB shoulder extension hold with alternating marching 15 x 3 with min assist for safety -Tandem stance 2x 30" -Vector stance 3x 5" -Leg press 2Pl 2x 10  12/04/2022  -Nustep seat 6 x 5' -GTB shoulder extension hold with alternating marching 15 x 3 with min assist for safety -YTB Anti-rotation stepout 2 x 5 bilaterally; cues for slow, lateral step out while maintaining anti-rotation tension through band -Lateral ball rollouts 2x10' bilaterally -Standing hip abduction with GTB  around knees 3 x 15 -Elevated deadlifting lifting mechanics x5 with 10lb weight, reducing tactile cues  11/29/22: -Nustep 7 seat and arms x 5 minutes [place] L3 -GTB shoulder extension -GTB sholder extension hold with marching alternating 10x 3" therapist min A for safety -GTB rows -GTB standing anti-rotation chest press in partial tandem stance 4x 10 -SLS Lt 3" Rt 3" max of 5 -Tandem stance 2x 15" with intermittent HHA required -Vector stance 3x5" with UE support  11/27/2022  -Nustep 7 seat and arms x 8 minutes -Standing // bars bird dogs 3x 30' -Standing hip abduction with GTB 1 x 15 -RTB standing anti-rotation chest press 2 x 7 with 4' count -ADL- practiced weighted lifting, mimicking deadlift from elevated object with 2lb bar and 6lb ankle weights 2 sets of 5 with tactile cues for hip hinge and proper timing of hips and knee arch of motion. Tactile cues at ASIS.  -Tandem stance without UE support 2 x 15 bilaterally  PATIENT EDUCATION:  Education details: PT Evaluation, findings, prognosis, frequency, attendance policy, and HEP if given.  Person educated: Patient Education method: Medical illustrator Education comprehension: verbalized understanding  HOME EXERCISE PROGRAM: Access Code: [email or web address] URL: [email or web address] Date: 11/16/2022 Prepared by: Starling Manns  Exercises - Supine Lower Trunk Rotation  - 1 x daily - 7 x weekly - 3 sets - 10 reps - Clamshell  - 1 x daily - 7 x weekly - 3 sets - 10 reps - Supine Bridge  - 1 x daily - 7 x weekly - 3 sets - 10 reps  - Single Leg Stance  - 1-2 x daily - 7 x weekly - 1 sets - 5 reps - 30" hold - Standing Tandem Balance with Counter Support  - 1 x daily - 7 x weekly - 1 sets - 3 reps - 30" hold ASSESSMENT:  CLINICAL IMPRESSION: Continued session focus with core stability and balance training.  Added sidestep for glut med strengthening to assist with balance training.  Cueing to improve  mechanics with proper lifting, pt able to complete dead lifting and reports no pain with task.  Added leg press for posterior chair strengthening.  Tolerated well with reports of pain reduced at EOS though still present.   OBJECTIVE IMPAIRMENTS: Abnormal gait, decreased balance, decreased coordination, decreased mobility, decreased ROM, decreased strength, impaired flexibility, and pain.   ACTIVITY LIMITATIONS: carrying, lifting, bending, sitting, standing, squatting, sleeping, transfers, and locomotion level  PARTICIPATION LIMITATIONS: cleaning, laundry, shopping, community activity, occupation, and yard work  PERSONAL FACTORS: Age are also affecting patient's functional outcome.   REHAB POTENTIAL: Good  CLINICAL DECISION MAKING: Stable/uncomplicated  EVALUATION COMPLEXITY: Low   GOALS: Goals reviewed with patient? No  SHORT TERM GOALS: Target date: 12/14/18  Pt will be independent with HEP in order to demonstrate participation in Physical Therapy POC.  Baseline: Goal status: INITIAL  2.  Pt will report 3/10 pain with mobility in order to demonstrate improved pain with functional activities.  Baseline: 7/10 worst Goal status: INITIAL  LONG TERM GOALS: Target date: 01/11/2023  Pt will improve 30 second chair test by MCID in order to demonstrate improved functional strength to return to desired activities.  Baseline: next session Goal status: INITIAL  2.  Pt will improve bilateral hip MMT by > 1/2 grade in order to improve pain tolerance during community mobility activities. .  Baseline: see MMT Goal status: INITIAL  3.  Pt will improve Modified Oswestry score by > 5 points in order to demonstrate improved pain with functional goals and outcomes. Baseline: see objective Goal status: INITIAL  4.  Pt will report 2/10 pain with mobility in order to demonstrate reduced pain with overall functional status.  Baseline: see objective Goal status: INITIAL  PLAN:  PT  FREQUENCY: 2x/week  PT DURATION: 8 weeks  PLANNED INTERVENTIONS: 97164- PT Re-evaluation, 97110-Therapeutic exercises, 97530- Therapeutic activity, 97112- Neuromuscular re-education, 97535- Self Care, 98119- Manual therapy, [phone]- Gait training, Balance training, and Stair training.  PLAN FOR NEXT SESSION: core stability, BLE strengthening  Becky Sax, LPTA/CLT; CBIS [phone]  Juel Burrow, PTA 12/06/2022, 4:29 PM

## 2022-12-11 ENCOUNTER — Encounter (HOSPITAL_COMMUNITY): Payer: Self-pay

## 2022-12-11 ENCOUNTER — Ambulatory Visit (HOSPITAL_COMMUNITY): Payer: Medicare PPO

## 2022-12-11 DIAGNOSIS — M6281 Muscle weakness (generalized): Secondary | ICD-10-CM

## 2022-12-11 DIAGNOSIS — M47816 Spondylosis without myelopathy or radiculopathy, lumbar region: Secondary | ICD-10-CM

## 2022-12-11 NOTE — Therapy (Signed)
OUTPATIENT PHYSICAL THERAPY THORACOLUMBAR TREATMENT   Patient Name: Casey Armstrong MRN: [id number] DOB:1945-04-28, 77 y.o., female Today's Date: 12/11/2022  END OF SESSION:  PT End of Session - 12/11/22 1352     Visit Number 9    Number of Visits 12    Date for PT Re-Evaluation 01/13/23    Authorization Type [employer]    Authorization Time Period 12v-pending date -01/13/23    Authorization - Visit Number 8    Authorization - Number of Visits 12    Progress Note Due on Visit 10    PT Start Time 1303    PT Stop Time 1345    PT Time Calculation (min) 42 min    Activity Tolerance Patient tolerated treatment well    Behavior During Therapy WFL for tasks assessed/performed                 Past Medical History:  Diagnosis Date   Arthritis    Headache    Lichen sclerosus    Neuromuscular disorder (HCC)    PONV (postoperative nausea and vomiting)    Past Surgical History:  Procedure Laterality Date   CATARACT EXTRACTION W/PHACO  08/15/2010   Procedure: CATARACT EXTRACTION PHACO AND INTRAOCULAR LENS PLACEMENT (IOC);  Surgeon: Gemma Payor;  Location: [facility];  Service: Ophthalmology;  Laterality: Left;  CDE 14.67   CATARACT EXTRACTION W/PHACO  08/25/2010   Procedure: CATARACT EXTRACTION PHACO AND INTRAOCULAR LENS PLACEMENT (IOC);  Surgeon: Gemma Payor;  Location: [facility];  Service: Ophthalmology;  Laterality: Right;  CDE 12.65   COLONOSCOPY  01/30/2011   Procedure: COLONOSCOPY;  Surgeon: Corbin Ade, MD;  Location: [facility];  Service: Endoscopy;  Laterality: N/A;  8:30 AM   DILATION AND CURETTAGE OF UTERUS     30+ yrs ago   EYE SURGERY     left KPE w/ IOL   TONSILLECTOMY     TUBAL LIGATION     Patient Active Problem List   Diagnosis Date Noted   Spondylolisthesis of lumbar region 05/03/2022   Superficial fungus infection of skin 03/03/2020   Vulvar itching 06/05/2016   Lichen sclerosus et atrophicus of the vulva 02/03/2016    PCP: Carylon Perches MD  REFERRING  PROVIDER: Floreen Comber, NP   REFERRING DIAG: [phone] (ICD-10-CM) - Spondylosis without myelopathy or radiculopathy, lumbar region   Rationale for Evaluation and Treatment: Rehabilitation  THERAPY DIAG:  Muscle weakness (generalized)  Spondylosis of lumbar region without myelopathy or radiculopathy  ONSET DATE: 05/03/2022  Next apt date: 12/14/22  SUBJECTIVE:  SUBJECTIVE STATEMENT: Pt reports falling on Thursday evening @ 5pm. She fell sideways over her rain boots. Pt was able to recover  independently after some time. 4/10 Pain in low back today.   PERTINENT HISTORY:  Posterior Fusion   PAIN:  Are you having pain? Yes: NPRS scale: 4/10 Pain location: Bilateral SIJ and spans out laterally Pain description: Achy, sharp, tingles and just "hurts" its just annoying Aggravating factors: long term functional activities Relieving factors: "nothing"  PRECAUTIONS: None  RED FLAGS: None   WEIGHT BEARING RESTRICTIONS: No  FALLS:  Has patient fallen in last 6 months? No  LIVING ENVIRONMENT: Lives with: lives alone Lives in: House/apartment Stairs: Yes: Internal: 1 flight steps; on left going up and External: 2 steps; on left going up Has following equipment at home: Walker - 2 wheeled and bed side commode  OCCUPATION: Retired; cares for grandchild  PLOF: Independent  PATIENT GOALS: "bend over without hurting"  NEXT MD VISIT: Next Month  OBJECTIVE:  Note: Objective measures were completed at Evaluation unless otherwise noted.  DIAGNOSTIC FINDINGS:    PATIENT SURVEYS:  Modified Oswestry 24/50    COGNITION: Overall cognitive status: Within functional limits for tasks assessed     SENSATION: WFL   POSTURE:   PALPATION: Increased tension through lumbar paraspinals  LUMBAR  ROM:   AROM eval  Flexion 70%  Extension 10%  Right lateral flexion 20%  Left lateral flexion 20%  Right rotation   Left rotation    (Blank rows = not tested)  LOWER EXTREMITY ROM:     Active  Right eval Left eval  Hip flexion    Hip extension    Hip abduction    Hip adduction    Hip internal rotation    Hip external rotation    Knee flexion    Knee extension    Ankle dorsiflexion    Ankle plantarflexion    Ankle inversion    Ankle eversion     (Blank rows = not tested)  LOWER EXTREMITY MMT:    MMT Right eval Left eval  Hip flexion 3- 3+  Hip extension 3 3  Hip abduction 3+ 3+  Hip adduction    Hip internal rotation    Hip external rotation    Knee flexion    Knee extension 4- 4-  Ankle dorsiflexion    Ankle plantarflexion    Ankle inversion    Ankle eversion     (Blank rows = not tested)    FUNCTIONAL TESTS:  30 seconds chair stand test:9x 2 minute walk test: 326ft  GAIT: Distance walked: 310 feet Assistive device utilized: None Level of assistance: Complete Independence Comments: Left lean in trunk with bilateral trendelenberg, R shoulder elevated during ambulation.    TODAY'S TREATMENT:                                                                                                                              DATE:  12/11/2022  -Nustep 7 seat and arms x 6 minutes [place] L3 3x circuit -Tandem stance on aeromat 2x30' -Blue TB shoulder extension hold with alternating marching 15 x 3 with min assist for safety -Tandem walking on 24ft black line; CGA -2x 5 of deadlift mechanics with 18in box lifting 3kGr (blue) ball- cues for anterior hip movement throughout arc of motion. Blue mat under feet and 18in box under 2in step for dynamic balance training.  -Discussion regarding fall and potential addition balance to original order.   12/06/22: -Nustep 7 seat and arms x 5 minutes [place] L3 - SLS: Lt 4", Rt 4" -Sidestep with RTB  around thigh -Deadlifting mechanics 10x -18in box lifting yellow weighted ball (3.6#) 2x 5 -GTB shoulder extension hold with alternating marching 15 x 3 with min assist for safety -Tandem stance 2x 30" -Vector stance 3x 5" -Leg press 2Pl 2x 10  12/04/2022  -Nustep seat 6 x 5' -GTB shoulder extension hold with alternating marching 15 x 3 with min assist for safety -YTB Anti-rotation stepout 2 x 5 bilaterally; cues for slow, lateral step out while maintaining anti-rotation tension through band -Lateral ball rollouts 2x10' bilaterally -Standing hip abduction with GTB around knees 3 x 15 -Elevated deadlifting lifting mechanics x5 with 10lb weight, reducing tactile cues  PATIENT EDUCATION:  Education details: PT Evaluation, findings, prognosis, frequency, attendance policy, and HEP if given.  Person educated: Patient Education method: Medical illustrator Education comprehension: verbalized understanding  HOME EXERCISE PROGRAM: Access Code: [email or web address] URL: [email or web address] Date: 11/16/2022 Prepared by: Starling Manns  Exercises - Supine Lower Trunk Rotation  - 1 x daily - 7 x weekly - 3 sets - 10 reps - Clamshell  - 1 x daily - 7 x weekly - 3 sets - 10 reps - Supine Bridge  - 1 x daily - 7 x weekly - 3 sets - 10 reps  - Single Leg Stance  - 1-2 x daily - 7 x weekly - 1 sets - 5 reps - 30" hold - Standing Tandem Balance with Counter Support  - 1 x daily - 7 x weekly - 1 sets - 3 reps - 30" hold ASSESSMENT:  CLINICAL IMPRESSION: Continued session focus with core stability and balance training. Included more dynamic and variable surface balancing to core stability and posterior chain strengthening. Pt continuing to benefit from guarding due to more balance deficits that seem to be due to distal LE weakness, as a hypothesis. Discussed with pt that she would discuss with upcoming MD appointment to add balance deficits to address both concerns. Pt showing great  progress and carryover for deadlift skills and improve tolerance. Pt will benefit from skilled Physical Therapy services to address deficits/limitations in order to improve functional and QOL.    OBJECTIVE IMPAIRMENTS: Abnormal gait, decreased balance, decreased coordination, decreased mobility, decreased ROM, decreased strength, impaired flexibility, and pain.   ACTIVITY LIMITATIONS: carrying, lifting, bending, sitting, standing, squatting, sleeping, transfers, and locomotion level  PARTICIPATION LIMITATIONS: cleaning, laundry, shopping, community activity, occupation, and yard work  PERSONAL FACTORS: Age are also affecting patient's functional outcome.   REHAB POTENTIAL: Good  CLINICAL DECISION MAKING: Stable/uncomplicated  EVALUATION COMPLEXITY: Low   GOALS: Goals reviewed with patient? No  SHORT TERM GOALS: Target date: 12/14/18  Pt will be independent with HEP in order to demonstrate participation in Physical Therapy POC.  Baseline: Goal status: INITIAL  2.  Pt will report 3/10 pain with mobility in order to demonstrate improved pain with functional activities.  Baseline: 7/10 worst Goal status: INITIAL  LONG TERM GOALS: Target date: 01/11/2023  Pt will improve 30 second chair test by MCID in order to demonstrate improved functional strength to return to desired activities.  Baseline: next session Goal status: INITIAL  2.  Pt will improve bilateral hip MMT by > 1/2 grade in order to improve pain tolerance during community mobility activities. .  Baseline: see MMT Goal status: INITIAL  3.  Pt will improve Modified Oswestry score by > 5 points in order to demonstrate improved pain with functional goals and outcomes. Baseline: see objective Goal status: INITIAL  4.  Pt will report 2/10 pain with mobility in order to demonstrate reduced pain with overall functional status.  Baseline: see objective Goal status: INITIAL  PLAN:  PT FREQUENCY: 2x/week  PT DURATION: 8  weeks  PLANNED INTERVENTIONS: 97164- PT Re-evaluation, 97110-Therapeutic exercises, 97530- Therapeutic activity, 97112- Neuromuscular re-education, 97535- Self Care, 16109- Manual therapy, [phone]- Gait training, Balance training, and Stair training.  PLAN FOR NEXT SESSION: core stability, BLE strengthening  Nelida Meuse PT, DPT Physical Therapist with [facility] [phone] office Nelida Meuse, PT 12/11/2022, 1:54 PM

## 2022-12-13 ENCOUNTER — Encounter (HOSPITAL_COMMUNITY): Payer: Medicare PPO

## 2022-12-13 ENCOUNTER — Encounter (HOSPITAL_COMMUNITY): Payer: Self-pay

## 2022-12-13 ENCOUNTER — Ambulatory Visit (HOSPITAL_COMMUNITY): Payer: Medicare PPO

## 2022-12-13 DIAGNOSIS — M6281 Muscle weakness (generalized): Secondary | ICD-10-CM | POA: Diagnosis not present

## 2022-12-13 DIAGNOSIS — M47816 Spondylosis without myelopathy or radiculopathy, lumbar region: Secondary | ICD-10-CM | POA: Diagnosis not present

## 2022-12-13 NOTE — Therapy (Addendum)
OUTPATIENT PHYSICAL THERAPY THORACOLUMBAR TREATMENT   Patient Name: Casey Armstrong MRN: [id number] DOB:08-Jul-1945, 77 y.o., female Today's Date: 12/13/2022  END OF SESSION:  PT End of Session - 12/13/22 1155     Visit Number 10    Number of Visits 12    Date for PT Re-Evaluation 01/13/23    Authorization Type [employer]    Authorization Time Period 12v-pending date -01/13/23    Authorization - Visit Number 10    Authorization - Number of Visits 12    Progress Note Due on Visit 10    PT Start Time 1104    PT Stop Time 1148    PT Time Calculation (min) 44 min    Activity Tolerance Patient tolerated treatment well    Behavior During Therapy WFL for tasks assessed/performed                  Past Medical History:  Diagnosis Date   Arthritis    Headache    Lichen sclerosus    Neuromuscular disorder (HCC)    PONV (postoperative nausea and vomiting)    Past Surgical History:  Procedure Laterality Date   CATARACT EXTRACTION W/PHACO  08/15/2010   Procedure: CATARACT EXTRACTION PHACO AND INTRAOCULAR LENS PLACEMENT (IOC);  Surgeon: Gemma Payor;  Location: [facility];  Service: Ophthalmology;  Laterality: Left;  CDE 14.67   CATARACT EXTRACTION W/PHACO  08/25/2010   Procedure: CATARACT EXTRACTION PHACO AND INTRAOCULAR LENS PLACEMENT (IOC);  Surgeon: Gemma Payor;  Location: [facility];  Service: Ophthalmology;  Laterality: Right;  CDE 12.65   COLONOSCOPY  01/30/2011   Procedure: COLONOSCOPY;  Surgeon: Corbin Ade, MD;  Location: [facility];  Service: Endoscopy;  Laterality: N/A;  8:30 AM   DILATION AND CURETTAGE OF UTERUS     30+ yrs ago   EYE SURGERY     left KPE w/ IOL   TONSILLECTOMY     TUBAL LIGATION     Patient Active Problem List   Diagnosis Date Noted   Spondylolisthesis of lumbar region 05/03/2022   Superficial fungus infection of skin 03/03/2020   Vulvar itching 06/05/2016   Lichen sclerosus et atrophicus of the vulva 02/03/2016    PCP: Carylon Perches  MD  REFERRING PROVIDER: Floreen Comber, NP   REFERRING DIAG: [phone] (ICD-10-CM) - Spondylosis without myelopathy or radiculopathy, lumbar region   Rationale for Evaluation and Treatment: Rehabilitation  THERAPY DIAG:  Muscle weakness (generalized)  Spondylosis of lumbar region without myelopathy or radiculopathy  ONSET DATE: 05/03/2022  Next apt date: 12/14/22  SUBJECTIVE:  SUBJECTIVE STATEMENT: Pt reports falling on Thursday evening @ 5pm. She fell sideways over her rain boots. Pt was able to recover  independently after some time. 4/10 Pain in low back today.   Pt returns to MD tomorrow.  Reports constant pain Lt side lower back for last 2 days, pain scale 4/10.  PERTINENT HISTORY:  Posterior Fusion   PAIN:  Are you having pain? Yes: NPRS scale: 4/10 Pain location: Bilateral SIJ and spans out laterally Pain description: Achy, sharp, tingles and just "hurts" its just annoying Aggravating factors: long term functional activities Relieving factors: "nothing"  PRECAUTIONS: None  RED FLAGS: None   WEIGHT BEARING RESTRICTIONS: No  FALLS:  Has patient fallen in last 6 months? No  LIVING ENVIRONMENT: Lives with: lives alone Lives in: House/apartment Stairs: Yes: Internal: 1 flight steps; on left going up and External: 2 steps; on left going up Has following equipment at home: Walker - 2 wheeled and bed side commode  OCCUPATION: Retired; cares for grandchild  PLOF: Independent  PATIENT GOALS: "bend over without hurting"  NEXT MD VISIT: Next Month  OBJECTIVE:  Note: Objective measures were completed at Evaluation unless otherwise noted.  DIAGNOSTIC FINDINGS:    PATIENT SURVEYS:  Modified Oswestry 24/50  12/13/22: Oswestry 22/50   COGNITION: Overall cognitive status:  Within functional limits for tasks assessed     SENSATION: WFL   POSTURE:   PALPATION: Increased tension through lumbar paraspinals  LUMBAR ROM:   AROM eval  Flexion 70%  Extension 10%  Right lateral flexion 20%  Left lateral flexion 20%  Right rotation   Left rotation    (Blank rows = not tested)  LOWER EXTREMITY ROM:     Active  Right eval Left eval  Hip flexion    Hip extension    Hip abduction    Hip adduction    Hip internal rotation    Hip external rotation    Knee flexion    Knee extension    Ankle dorsiflexion    Ankle plantarflexion    Ankle inversion    Ankle eversion     (Blank rows = not tested)  LOWER EXTREMITY MMT:    MMT Right eval Left eval Right 12/13/22 Left 12/13/22  Hip flexion 3- 3+ 4/5 4/5  Hip extension 3 3 3+ 3/5  Hip abduction 3+ 3+ 4- 4/5  Hip adduction      Hip internal rotation      Hip external rotation      Knee flexion   4 4  Knee extension 4- 4- 4 4  Ankle dorsiflexion      Ankle plantarflexion      Ankle inversion      Ankle eversion       (Blank rows = not tested)    FUNCTIONAL TESTS:  30 seconds chair stand test:9x 2 minute walk test: 356ft  12/13/22: 449ft 30 seconds chair stand test:10x    GAIT: Distance walked: 310 feet Assistive device utilized: None Level of assistance: Complete Independence Comments: Left lean in trunk with bilateral trendelenberg, R shoulder elevated during ambulation.    TODAY'S TREATMENT:  DATE:  12/13/22: - 450ft no AD -Oswestry 22/50 - 30 seconds chair stand test:10x -MMT -Reviewed goals -Deadlift mechanics 5# PVC pipe -SLS 4" max BLE -Vector stance 3x 5" BLE with 1 HHA    12/11/2022  -Nustep 7 seat and arms x 6 minutes [place] L3 3x circuit -Tandem stance on aeromat 2x30' -Blue TB shoulder extension hold with  alternating marching 15 x 3 with min assist for safety -Tandem walking on 42ft black line; CGA -2x 5 of deadlift mechanics with 18in box lifting 3kGr (blue) ball- cues for anterior hip movement throughout arc of motion. Blue mat under feet and 18in box under 2in step for dynamic balance training.  -Discussion regarding fall and potential addition balance to original order.   12/06/22: -Nustep 7 seat and arms x 5 minutes [place] L3 - SLS: Lt 4", Rt 4" -Sidestep with RTB around thigh -Deadlifting mechanics 10x -18in box lifting yellow weighted ball (3.6#) 2x 5 -GTB shoulder extension hold with alternating marching 15 x 3 with min assist for safety -Tandem stance 2x 30" -Vector stance 3x 5" -Leg press 2Pl 2x 10  12/04/2022  -Nustep seat 6 x 5' -GTB shoulder extension hold with alternating marching 15 x 3 with min assist for safety -YTB Anti-rotation stepout 2 x 5 bilaterally; cues for slow, lateral step out while maintaining anti-rotation tension through band -Lateral ball rollouts 2x10' bilaterally -Standing hip abduction with GTB around knees 3 x 15 -Elevated deadlifting lifting mechanics x5 with 10lb weight, reducing tactile cues  PATIENT EDUCATION:  Education details: PT Evaluation, findings, prognosis, frequency, attendance policy, and HEP if given.  Person educated: Patient Education method: Medical illustrator Education comprehension: verbalized understanding  HOME EXERCISE PROGRAM: Access Code: [email or web address] URL: [email or web address] Date: 11/16/2022 Prepared by: Starling Manns  Exercises - Supine Lower Trunk Rotation  - 1 x daily - 7 x weekly - 3 sets - 10 reps - Clamshell  - 1 x daily - 7 x weekly - 3 sets - 10 reps - Supine Bridge  - 1 x daily - 7 x weekly - 3 sets - 10 reps  - Single Leg Stance  - 1-2 x daily - 7 x weekly - 1 sets - 5 reps - 30" hold - Standing Tandem Balance with Counter Support  - 1 x daily - 7 x weekly - 1 sets - 3  reps - 30" hold ASSESSMENT:  CLINICAL IMPRESSION: Reviewed goals with the following findings:  Pt reports compliance with HEP daily and has added balance activities to current exercise program.  Objective findings present with increased cadence during .  Pt continues to demonstrate weak hip muscualture and difficulty with SLS based exercises.  Pt limited by pain with functional movements especially while lifting leg in bath.  Pt will continue to benefit from skilled intervention to address goals unmet with additional balance to prevent injury.  Pt with MD apt tomorrow.     OBJECTIVE IMPAIRMENTS: Abnormal gait, decreased balance, decreased coordination, decreased mobility, decreased ROM, decreased strength, impaired flexibility, and pain.   ACTIVITY LIMITATIONS: carrying, lifting, bending, sitting, standing, squatting, sleeping, transfers, and locomotion level  PARTICIPATION LIMITATIONS: cleaning, laundry, shopping, community activity, occupation, and yard work  PERSONAL FACTORS: Age are also affecting patient's functional outcome.   REHAB POTENTIAL: Good  CLINICAL DECISION MAKING: Stable/uncomplicated  EVALUATION COMPLEXITY: Low   GOALS: Goals reviewed with patient? No  SHORT TERM GOALS: Target date: 12/14/18  Pt will be independent with HEP in order to demonstrate participation  in Physical Therapy POC.  Baseline: 12/13/22:  Reports compliance with HEP daily Goal status: MET  2.  Pt will report 3/10 pain with mobility in order to demonstrate improved pain with functional activities.  Baseline: 7/10 worst; 12/13/22 usually 4/10 has increased to 7/10 when taking bath while having to hold leg in air Goal status: IN PROGRESS  LONG TERM GOALS: Target date: 01/11/2023  Pt will improve 30 second chair test by MCID in order to demonstrate improved functional strength to return to desired activities.  Baseline:  Eval 9 times;  12/13/22 improved to 10 times Goal status: IN  PROGRESS  2.  Pt will improve bilateral hip MMT by > 1/2 grade in order to improve pain tolerance during community mobility activities. .  Baseline: see MMT Goal status: IN PROGRESS  3.  Pt will improve Modified Oswestry score by > 5 points in order to demonstrate improved pain with functional goals and outcomes. Baseline: see objective Goal status: IN PROGRESS  4.  Pt will report 2/10 pain with mobility in order to demonstrate reduced pain with overall functional status.  Baseline: see objective Goal status: NOT MET  PLAN:  PT FREQUENCY: 2x/week  PT DURATION: 4 weeks  PLANNED INTERVENTIONS: 97164- PT Re-evaluation, 97110-Therapeutic exercises, 97530- Therapeutic activity, 97112- Neuromuscular re-education, 97535- Self Care, 03474- Manual therapy, [phone]- Gait training, Balance training, and Stair training.  PLAN FOR NEXT SESSION: core stability, BLE strengthening  Nelida Meuse PT, DPT Physical Therapist with [facility] [phone] office  [facility] Auth Request  Referring diagnosis code (ICD 10)? M47.816 (ICD-10-CM) - Spondylosis without myelopathy or radiculopathy, lumbar region  Treatment diagnosis codes (ICD 10)? (if different than referring diagnosis) M47.816 (ICD-10-CM) - Spondylosis without myelopathy or radiculopathy, lumbar region  What was this (referring dx) caused by? [x]  Surgery []  Fall []  Ongoing issue []  Arthritis []  Other: ____________  Laterality: []  Rt []  Lt [x]  Both  Deficits: [x]  Pain [x]  Stiffness [x]  Weakness []  Edema [x]  Balance Deficits []  Coordination [x]  Gait Disturbance []  ROM []  Other   Functional Tool Score: Modified Oswestry 22/50  CPT codes: See Planned Interventions listed in the Plan section of the Evaluation.    Juel Burrow, PTA 12/13/2022, 11:56 AM

## 2022-12-14 DIAGNOSIS — M4316 Spondylolisthesis, lumbar region: Secondary | ICD-10-CM | POA: Diagnosis not present

## 2022-12-18 ENCOUNTER — Encounter (HOSPITAL_COMMUNITY): Payer: Self-pay

## 2022-12-18 ENCOUNTER — Ambulatory Visit (HOSPITAL_COMMUNITY): Payer: Medicare PPO

## 2022-12-18 DIAGNOSIS — M6281 Muscle weakness (generalized): Secondary | ICD-10-CM | POA: Diagnosis not present

## 2022-12-18 DIAGNOSIS — M47816 Spondylosis without myelopathy or radiculopathy, lumbar region: Secondary | ICD-10-CM | POA: Diagnosis not present

## 2022-12-18 NOTE — Addendum Note (Signed)
Addended by: Starling Manns D on: 12/18/2022 02:14 PM   Modules accepted: Orders

## 2022-12-18 NOTE — Therapy (Signed)
OUTPATIENT PHYSICAL THERAPY THORACOLUMBAR TREATMENT   Patient Name: Casey Armstrong MRN: [id number] DOB:11/10/1945, 77 y.o., female Today's Date: 12/18/2022  END OF SESSION:  PT End of Session - 12/18/22 1353     Visit Number 11    Number of Visits 12    Date for PT Re-Evaluation 01/13/23    Authorization Type [employer]    Authorization Time Period pending new auth request 8x from 11/20-12/25    Authorization - Visit Number 11    Authorization - Number of Visits 12    Progress Note Due on Visit 12    PT Start Time 1302    PT Stop Time 1345    PT Time Calculation (min) 43 min    Activity Tolerance Patient tolerated treatment well    Behavior During Therapy WFL for tasks assessed/performed                   Past Medical History:  Diagnosis Date   Arthritis    Headache    Lichen sclerosus    Neuromuscular disorder (HCC)    PONV (postoperative nausea and vomiting)    Past Surgical History:  Procedure Laterality Date   CATARACT EXTRACTION W/PHACO  08/15/2010   Procedure: CATARACT EXTRACTION PHACO AND INTRAOCULAR LENS PLACEMENT (IOC);  Surgeon: Gemma Payor;  Location: [facility];  Service: Ophthalmology;  Laterality: Left;  CDE 14.67   CATARACT EXTRACTION W/PHACO  08/25/2010   Procedure: CATARACT EXTRACTION PHACO AND INTRAOCULAR LENS PLACEMENT (IOC);  Surgeon: Gemma Payor;  Location: [facility];  Service: Ophthalmology;  Laterality: Right;  CDE 12.65   COLONOSCOPY  01/30/2011   Procedure: COLONOSCOPY;  Surgeon: Corbin Ade, MD;  Location: [facility];  Service: Endoscopy;  Laterality: N/A;  8:30 AM   DILATION AND CURETTAGE OF UTERUS     30+ yrs ago   EYE SURGERY     left KPE w/ IOL   TONSILLECTOMY     TUBAL LIGATION     Patient Active Problem List   Diagnosis Date Noted   Spondylolisthesis of lumbar region 05/03/2022   Superficial fungus infection of skin 03/03/2020   Vulvar itching 06/05/2016   Lichen sclerosus et atrophicus of the vulva 02/03/2016    PCP:  Carylon Perches MD  REFERRING PROVIDER: Floreen Comber, NP   REFERRING DIAG: [phone] (ICD-10-CM) - Spondylosis without myelopathy or radiculopathy, lumbar region   Rationale for Evaluation and Treatment: Rehabilitation  THERAPY DIAG:  Muscle weakness (generalized)  Spondylosis of lumbar region without myelopathy or radiculopathy  ONSET DATE: 05/03/2022  Next apt date: 12/14/22  SUBJECTIVE:  SUBJECTIVE STATEMENT: No other falls since the initial. Very little pain, 2-3/10.  PERTINENT HISTORY:  Posterior Fusion   PAIN:  Are you having pain? Yes: NPRS scale: 4/10 Pain location: Bilateral SIJ and spans out laterally Pain description: Achy, sharp, tingles and just "hurts" its just annoying Aggravating factors: long term functional activities Relieving factors: "nothing"  PRECAUTIONS: None  RED FLAGS: None   WEIGHT BEARING RESTRICTIONS: No  FALLS:  Has patient fallen in last 6 months? No  LIVING ENVIRONMENT: Lives with: lives alone Lives in: House/apartment Stairs: Yes: Internal: 1 flight steps; on left going up and External: 2 steps; on left going up Has following equipment at home: Walker - 2 wheeled and bed side commode  OCCUPATION: Retired; cares for grandchild  PLOF: Independent  PATIENT GOALS: "bend over without hurting"  NEXT MD VISIT: Next Month  OBJECTIVE:  Note: Objective measures were completed at Evaluation unless otherwise noted.  DIAGNOSTIC FINDINGS:    PATIENT SURVEYS:  Modified Oswestry 24/50  12/13/22: Oswestry 22/50   COGNITION: Overall cognitive status: Within functional limits for tasks assessed     SENSATION: WFL   POSTURE:   PALPATION: Increased tension through lumbar paraspinals  LUMBAR ROM:   AROM eval  Flexion 70%  Extension 10%  Right  lateral flexion 20%  Left lateral flexion 20%  Right rotation   Left rotation    (Blank rows = not tested)  LOWER EXTREMITY ROM:     Active  Right eval Left eval  Hip flexion    Hip extension    Hip abduction    Hip adduction    Hip internal rotation    Hip external rotation    Knee flexion    Knee extension    Ankle dorsiflexion    Ankle plantarflexion    Ankle inversion    Ankle eversion     (Blank rows = not tested)  LOWER EXTREMITY MMT:    MMT Right eval Left eval Right 12/13/22 Left 12/13/22  Hip flexion 3- 3+ 4/5 4/5  Hip extension 3 3 3+ 3/5  Hip abduction 3+ 3+ 4- 4/5  Hip adduction      Hip internal rotation      Hip external rotation      Knee flexion   4 4  Knee extension 4- 4- 4 4  Ankle dorsiflexion      Ankle plantarflexion      Ankle inversion      Ankle eversion       (Blank rows = not tested)    FUNCTIONAL TESTS:  30 seconds chair stand test:9x 2 minute walk test: 381ft  12/13/22: 464ft 30 seconds chair stand test:10x    GAIT: Distance walked: 310 feet Assistive device utilized: None Level of assistance: Complete Independence Comments: Left lean in trunk with bilateral trendelenberg, R shoulder elevated during ambulation.    TODAY'S TREATMENT:  DATE:  12/18/2022  -slow isometric hip extension with 2lb ankle weights -Vector stance 4x 5" BLE with 1 HHA -Attempted sumo deadlift with 5 and 10lb dumbbell-increased LB stress-terminated -Attempted single leg modified deadlift x 10 increased LB pain, terminated -Tandem balance stance on foam pad 3 x 30' -Stepping balance reactions anteriorly x 10 over black line- CGA for balance with one LOB laterally -Deadlift mechanics 5# with 5lb bar x 8 with tactile cues at ASIS.   12/13/22: - 432ft no AD -Oswestry 22/50 - 30 seconds chair stand  test:10x -MMT -Reviewed goals -Deadlift mechanics 5# PVC pipe -SLS 4" max BLE -Vector stance 3x 5" BLE with 1 HHA  12/11/2022  -Nustep 7 seat and arms x 6 minutes [place] L3 3x circuit -Tandem stance on aeromat 2x30' -Blue TB shoulder extension hold with alternating marching 15 x 3 with min assist for safety -Tandem walking on 78ft black line; CGA -2x 5 of deadlift mechanics with 18in box lifting 3kGr (blue) ball- cues for anterior hip movement throughout arc of motion. Blue mat under feet and 18in box under 2in step for dynamic balance training.  -Discussion regarding fall and potential addition balance to original order.    PATIENT EDUCATION:  Education details: PT Evaluation, findings, prognosis, frequency, attendance policy, and HEP if given.  Person educated: Patient Education method: Medical illustrator Education comprehension: verbalized understanding  HOME EXERCISE PROGRAM: Access Code: [email or web address] URL: [email or web address] Date: 11/16/2022 Prepared by: Starling Manns  Exercises - Supine Lower Trunk Rotation  - 1 x daily - 7 x weekly - 3 sets - 10 reps - Clamshell  - 1 x daily - 7 x weekly - 3 sets - 10 reps - Supine Bridge  - 1 x daily - 7 x weekly - 3 sets - 10 reps  - Single Leg Stance  - 1-2 x daily - 7 x weekly - 1 sets - 5 reps - 30" hold - Standing Tandem Balance with Counter Support  - 1 x daily - 7 x weekly - 1 sets - 3 reps - 30" hold ASSESSMENT:  CLINICAL IMPRESSION: Pt tolerating session well with focus on re-education of findings from PTA's session. Will request 2x for more 4 weeks to address gluteal weakness. Pt showing continue progress with form during deadlifting mechanics. Continues with mild balance that are being incorporated into posterior chain strengthening and stepping reactions to improve low back posture and balance..  Pt will continue to benefit from skilled intervention to address goals unmet with additional  balance to prevent injury.  Pt with MD apt tomorrow.     OBJECTIVE IMPAIRMENTS: Abnormal gait, decreased balance, decreased coordination, decreased mobility, decreased ROM, decreased strength, impaired flexibility, and pain.   ACTIVITY LIMITATIONS: carrying, lifting, bending, sitting, standing, squatting, sleeping, transfers, and locomotion level  PARTICIPATION LIMITATIONS: cleaning, laundry, shopping, community activity, occupation, and yard work  PERSONAL FACTORS: Age are also affecting patient's functional outcome.   REHAB POTENTIAL: Good  CLINICAL DECISION MAKING: Stable/uncomplicated  EVALUATION COMPLEXITY: Low   GOALS: Goals reviewed with patient? No  SHORT TERM GOALS: Target date: 12/14/18  Pt will be independent with HEP in order to demonstrate participation in Physical Therapy POC.  Baseline: 12/13/22:  Reports compliance with HEP daily Goal status: MET  2.  Pt will report 3/10 pain with mobility in order to demonstrate improved pain with functional activities.  Baseline: 7/10 worst; 12/13/22 usually 4/10 has increased to 7/10 when taking bath while having to hold leg in air Goal status:  IN PROGRESS  LONG TERM GOALS: Target date: 01/11/2023  Pt will improve 30 second chair test by MCID in order to demonstrate improved functional strength to return to desired activities.  Baseline:  Eval 9 times;  12/13/22 improved to 10 times Goal status: IN PROGRESS  2.  Pt will improve bilateral hip MMT by > 1/2 grade in order to improve pain tolerance during community mobility activities. .  Baseline: see MMT Goal status: IN PROGRESS  3.  Pt will improve Modified Oswestry score by > 5 points in order to demonstrate improved pain with functional goals and outcomes. Baseline: see objective Goal status: IN PROGRESS  4.  Pt will report 2/10 pain with mobility in order to demonstrate reduced pain with overall functional status.  Baseline: see objective Goal status: NOT  MET  PLAN:  PT FREQUENCY: 2x/week  PT DURATION: 8 weeks  PLANNED INTERVENTIONS: 97164- PT Re-evaluation, 97110-Therapeutic exercises, 97530- Therapeutic activity, 97112- Neuromuscular re-education, 97535- Self Care, 16109- Manual therapy, [phone]- Gait training, Balance training, and Stair training.  PLAN FOR NEXT SESSION: core stability, BLE strengthening  Nelida Meuse PT, DPT Physical Therapist with [facility] [phone] office  Nelida Meuse, PT 12/18/2022, 2:16 PM

## 2022-12-20 ENCOUNTER — Ambulatory Visit (HOSPITAL_COMMUNITY): Payer: Medicare PPO

## 2022-12-20 ENCOUNTER — Encounter (HOSPITAL_COMMUNITY): Payer: Self-pay

## 2022-12-20 DIAGNOSIS — M6281 Muscle weakness (generalized): Secondary | ICD-10-CM | POA: Diagnosis not present

## 2022-12-20 DIAGNOSIS — M47816 Spondylosis without myelopathy or radiculopathy, lumbar region: Secondary | ICD-10-CM

## 2022-12-20 NOTE — Therapy (Signed)
OUTPATIENT PHYSICAL THERAPY THORACOLUMBAR TREATMENT   Patient Name: ILYSSA Armstrong MRN: [id number] DOB:1945/05/28, 77 y.o., female Today's Date: 12/20/2022  END OF SESSION:  PT End of Session - 12/20/22 1314     Visit Number 12    Number of Visits 20    Date for PT Re-Evaluation 01/13/23    Authorization Type [employer]    Authorization Time Period pending new auth request 8x from 11/20-12/25    Authorization - Visit Number 12    Authorization - Number of Visits 12    Progress Note Due on Visit 20   progress note complete visit #10   PT Start Time 1309   late sign in (pt was waiting in waiting room prior sign in)   PT Stop Time 1347    PT Time Calculation (min) 38 min    Activity Tolerance Patient tolerated treatment well    Behavior During Therapy [facility] for tasks assessed/performed                    Past Medical History:  Diagnosis Date   Arthritis    Headache    Lichen sclerosus    Neuromuscular disorder (HCC)    PONV (postoperative nausea and vomiting)    Past Surgical History:  Procedure Laterality Date   CATARACT EXTRACTION W/PHACO  08/15/2010   Procedure: CATARACT EXTRACTION PHACO AND INTRAOCULAR LENS PLACEMENT (IOC);  Surgeon: Gemma Payor;  Location: [facility];  Service: Ophthalmology;  Laterality: Left;  CDE 14.67   CATARACT EXTRACTION W/PHACO  08/25/2010   Procedure: CATARACT EXTRACTION PHACO AND INTRAOCULAR LENS PLACEMENT (IOC);  Surgeon: Gemma Payor;  Location: [facility];  Service: Ophthalmology;  Laterality: Right;  CDE 12.65   COLONOSCOPY  01/30/2011   Procedure: COLONOSCOPY;  Surgeon: Corbin Ade, MD;  Location: [facility];  Service: Endoscopy;  Laterality: N/A;  8:30 AM   DILATION AND CURETTAGE OF UTERUS     30+ yrs ago   EYE SURGERY     left KPE w/ IOL   TONSILLECTOMY     TUBAL LIGATION     Patient Active Problem List   Diagnosis Date Noted   Spondylolisthesis of lumbar region 05/03/2022   Superficial fungus infection of skin 03/03/2020    Vulvar itching 06/05/2016   Lichen sclerosus et atrophicus of the vulva 02/03/2016    PCP: Carylon Perches MD  REFERRING PROVIDER: Floreen Comber, NP   REFERRING DIAG: [id number] (ICD-10-CM) - Spondylosis without myelopathy or radiculopathy, lumbar region   Rationale for Evaluation and Treatment: Rehabilitation  THERAPY DIAG:  Muscle weakness (generalized)  Spondylosis of lumbar region without myelopathy or radiculopathy  ONSET DATE: 05/03/2022  Next apt date: 12/14/22  SUBJECTIVE:  SUBJECTIVE STATEMENT: Continues to have a little constant pain in back, pain scale 2/10 today.    PERTINENT HISTORY:  Posterior Fusion   PAIN:  Are you having pain? Yes: NPRS scale: 2/10 Pain location: Bilateral SIJ and spans out laterally Pain description: Achy, sharp, tingles and just "hurts" its just annoying Aggravating factors: long term functional activities Relieving factors: "nothing"  PRECAUTIONS: None  RED FLAGS: None   WEIGHT BEARING RESTRICTIONS: No  FALLS:  Has patient fallen in last 6 months? No  LIVING ENVIRONMENT: Lives with: lives alone Lives in: House/apartment Stairs: Yes: Internal: 1 flight steps; on left going up and External: 2 steps; on left going up Has following equipment at home: Walker - 2 wheeled and bed side commode  OCCUPATION: Retired; cares for grandchild  PLOF: Independent  PATIENT GOALS: "bend over without hurting"  NEXT MD VISIT: Next Month  OBJECTIVE:  Note: Objective measures were completed at Evaluation unless otherwise noted.  DIAGNOSTIC FINDINGS:    PATIENT SURVEYS:  Modified Oswestry 24/50  12/13/22: Oswestry 22/50   COGNITION: Overall cognitive status: Within functional limits for tasks assessed     SENSATION: WFL   POSTURE:    PALPATION: Increased tension through lumbar paraspinals  LUMBAR ROM:   AROM eval  Flexion 70%  Extension 10%  Right lateral flexion 20%  Left lateral flexion 20%  Right rotation   Left rotation    (Blank rows = not tested)  LOWER EXTREMITY ROM:     Active  Right eval Left eval  Hip flexion    Hip extension    Hip abduction    Hip adduction    Hip internal rotation    Hip external rotation    Knee flexion    Knee extension    Ankle dorsiflexion    Ankle plantarflexion    Ankle inversion    Ankle eversion     (Blank rows = not tested)  LOWER EXTREMITY MMT:    MMT Right eval Left eval Right 12/13/22 Left 12/13/22  Hip flexion 3- 3+ 4/5 4/5  Hip extension 3 3 3+ 3/5  Hip abduction 3+ 3+ 4- 4/5  Hip adduction      Hip internal rotation      Hip external rotation      Knee flexion   4 4  Knee extension 4- 4- 4 4  Ankle dorsiflexion      Ankle plantarflexion      Ankle inversion      Ankle eversion       (Blank rows = not tested)    FUNCTIONAL TESTS:  30 seconds chair stand test:9x 2 minute walk test: 377ft  12/13/22: 434ft 30 seconds chair stand test:10x    GAIT: Distance walked: 310 feet Assistive device utilized: None Level of assistance: Complete Independence Comments: Left lean in trunk with bilateral trendelenberg, R shoulder elevated during ambulation.    TODAY'S TREATMENT:  DATE:  12/20/22 Squat March alternating 5" holds 10x reps Vector stance 5x 5" BLE with 1 HHA Tandem stance on foam 2x 30" Palloff in partial tandem stance with red weighted ball10x 2 sets Forward lunge onto 4in step with minimal HHA 10x Deadlift mechanics 5# with 5lb bar x 8 with tactile cues at ASIS  12/18/2022  -slow isometric hip extension with 2lb ankle weights -Vector stance 4x 5" BLE with 1 HHA -Attempted sumo deadlift with 5  and 10lb dumbbell-increased LB stress-terminated -Attempted single leg modified deadlift x 10 increased LB pain, terminated -Tandem balance stance on foam pad 3 x 30' -Stepping balance reactions anteriorly x 10 over black line- CGA for balance with one LOB laterally -Deadlift mechanics 5# with 5lb bar x 8 with tactile cues at ASIS.   12/13/22: - 435ft no AD -Oswestry 22/50 - 30 seconds chair stand test:10x -MMT -Reviewed goals -Deadlift mechanics 5# PVC pipe -SLS 4" max BLE -Vector stance 3x 5" BLE with 1 HHA  12/11/2022  -Nustep 7 seat and arms x 6 minutes [place] L3 3x circuit -Tandem stance on aeromat 2x30' -Blue TB shoulder extension hold with alternating marching 15 x 3 with min assist for safety -Tandem walking on 14ft black line; CGA -2x 5 of deadlift mechanics with 18in box lifting 3kGr (blue) ball- cues for anterior hip movement throughout arc of motion. Blue mat under feet and 18in box under 2in step for dynamic balance training.  -Discussion regarding fall and potential addition balance to original order.    PATIENT EDUCATION:  Education details: PT Evaluation, findings, prognosis, frequency, attendance policy, and HEP if given.  Person educated: Patient Education method: Medical illustrator Education comprehension: verbalized understanding  HOME EXERCISE PROGRAM: Access Code: [email or web address] URL: [email or web address] Date: 11/16/2022 Prepared by: Starling Manns  Exercises - Supine Lower Trunk Rotation  - 1 x daily - 7 x weekly - 3 sets - 10 reps - Clamshell  - 1 x daily - 7 x weekly - 3 sets - 10 reps - Supine Bridge  - 1 x daily - 7 x weekly - 3 sets - 10 reps  - Single Leg Stance  - 1-2 x daily - 7 x weekly - 1 sets - 5 reps - 30" hold - Standing Tandem Balance with Counter Support  - 1 x daily - 7 x weekly - 1 sets - 3 reps - 30" hold ASSESSMENT:  CLINICAL IMPRESSION: Session focus with proximal strengthening and balance for  core stability.  Added lunges as progression of functional strengthening that was tolerated well.  Pt continues to be challenged with balance activities required intermittent HHA and SBA for safety from therapist.  Multimodal cueing to improve mechanics with squats and deadlifting.  No reports of pain through session.    OBJECTIVE IMPAIRMENTS: Abnormal gait, decreased balance, decreased coordination, decreased mobility, decreased ROM, decreased strength, impaired flexibility, and pain.   ACTIVITY LIMITATIONS: carrying, lifting, bending, sitting, standing, squatting, sleeping, transfers, and locomotion level  PARTICIPATION LIMITATIONS: cleaning, laundry, shopping, community activity, occupation, and yard work  PERSONAL FACTORS: Age are also affecting patient's functional outcome.   REHAB POTENTIAL: Good  CLINICAL DECISION MAKING: Stable/uncomplicated  EVALUATION COMPLEXITY: Low   GOALS: Goals reviewed with patient? No  SHORT TERM GOALS: Target date: 12/14/18  Pt will be independent with HEP in order to demonstrate participation in Physical Therapy POC.  Baseline: 12/13/22:  Reports compliance with HEP daily Goal status: MET  2.  Pt will report 3/10 pain with mobility  in order to demonstrate improved pain with functional activities.  Baseline: 7/10 worst; 12/13/22 usually 4/10 has increased to 7/10 when taking bath while having to hold leg in air Goal status: IN PROGRESS  LONG TERM GOALS: Target date: 01/11/2023  Pt will improve 30 second chair test by MCID in order to demonstrate improved functional strength to return to desired activities.  Baseline:  Eval 9 times;  12/13/22 improved to 10 times Goal status: IN PROGRESS  2.  Pt will improve bilateral hip MMT by > 1/2 grade in order to improve pain tolerance during community mobility activities. .  Baseline: see MMT Goal status: IN PROGRESS  3.  Pt will improve Modified Oswestry score by > 5 points in order to demonstrate  improved pain with functional goals and outcomes. Baseline: see objective Goal status: IN PROGRESS  4.  Pt will report 2/10 pain with mobility in order to demonstrate reduced pain with overall functional status.  Baseline: see objective Goal status: NOT MET  PLAN:  PT FREQUENCY: 2x/week  PT DURATION: 8 weeks  PLANNED INTERVENTIONS: 97164- PT Re-evaluation, 97110-Therapeutic exercises, 97530- Therapeutic activity, 97112- Neuromuscular re-education, 97535- Self Care, 78295- Manual therapy, [phone]- Gait training, Balance training, and Stair training.  PLAN FOR NEXT SESSION: core stability, BLE strengthening  Becky Sax, LPTA/CLT; CBIS [phone]  Juel Burrow, PTA 12/20/2022, 2:59 PM

## 2022-12-25 ENCOUNTER — Ambulatory Visit (HOSPITAL_COMMUNITY): Payer: Medicare PPO | Attending: Student

## 2022-12-25 ENCOUNTER — Encounter (HOSPITAL_COMMUNITY): Payer: Self-pay

## 2022-12-25 DIAGNOSIS — M6281 Muscle weakness (generalized): Secondary | ICD-10-CM | POA: Diagnosis not present

## 2022-12-25 DIAGNOSIS — M47816 Spondylosis without myelopathy or radiculopathy, lumbar region: Secondary | ICD-10-CM | POA: Diagnosis not present

## 2022-12-25 NOTE — Therapy (Signed)
OUTPATIENT PHYSICAL THERAPY THORACOLUMBAR TREATMENT   Patient Name: Casey Armstrong MRN: [id number] DOB:1945/12/19, 77 y.o., female Today's Date: 12/25/2022  END OF SESSION:  PT End of Session - 12/25/22 1227     Visit Number 13    Number of Visits 20    Date for PT Re-Evaluation 01/23/23    Authorization Type [employer]    Authorization Time Period 8v from 12/21/22-01/23/23    Authorization - Visit Number 1    Authorization - Number of Visits 8    Progress Note Due on Visit 8    PT Start Time 1145    PT Stop Time 1227    PT Time Calculation (min) 42 min    Activity Tolerance Patient tolerated treatment well    Behavior During Therapy [facility] for tasks assessed/performed                     Past Medical History:  Diagnosis Date   Arthritis    Headache    Lichen sclerosus    Neuromuscular disorder (HCC)    PONV (postoperative nausea and vomiting)    Past Surgical History:  Procedure Laterality Date   CATARACT EXTRACTION W/PHACO  08/15/2010   Procedure: CATARACT EXTRACTION PHACO AND INTRAOCULAR LENS PLACEMENT (IOC);  Surgeon: Gemma Payor;  Location: [facility];  Service: Ophthalmology;  Laterality: Left;  CDE 14.67   CATARACT EXTRACTION W/PHACO  08/25/2010   Procedure: CATARACT EXTRACTION PHACO AND INTRAOCULAR LENS PLACEMENT (IOC);  Surgeon: Gemma Payor;  Location: [facility];  Service: Ophthalmology;  Laterality: Right;  CDE 12.65   COLONOSCOPY  01/30/2011   Procedure: COLONOSCOPY;  Surgeon: Corbin Ade, MD;  Location: [facility];  Service: Endoscopy;  Laterality: N/A;  8:30 AM   DILATION AND CURETTAGE OF UTERUS     30+ yrs ago   EYE SURGERY     left KPE w/ IOL   TONSILLECTOMY     TUBAL LIGATION     Patient Active Problem List   Diagnosis Date Noted   Spondylolisthesis of lumbar region 05/03/2022   Superficial fungus infection of skin 03/03/2020   Vulvar itching 06/05/2016   Lichen sclerosus et atrophicus of the vulva 02/03/2016    PCP: Carylon Perches  MD  REFERRING PROVIDER: Floreen Comber, NP   REFERRING DIAG: [phone] (ICD-10-CM) - Spondylosis without myelopathy or radiculopathy, lumbar region   Rationale for Evaluation and Treatment: Rehabilitation  THERAPY DIAG:  Muscle weakness (generalized)  Spondylosis of lumbar region without myelopathy or radiculopathy  ONSET DATE: 05/03/2022  Next apt date: 12/14/22  SUBJECTIVE:  SUBJECTIVE STATEMENT: Pt reporting 3/10 pain today. Pt received a call from insurance they may not pay for new PT visits.    PERTINENT HISTORY:  Posterior Fusion   PAIN:  Are you having pain? Yes: NPRS scale: 2/10 Pain location: Bilateral SIJ and spans out laterally Pain description: Achy, sharp, tingles and just "hurts" its just annoying Aggravating factors: long term functional activities Relieving factors: "nothing"  PRECAUTIONS: None  RED FLAGS: None   WEIGHT BEARING RESTRICTIONS: No  FALLS:  Has patient fallen in last 6 months? No  LIVING ENVIRONMENT: Lives with: lives alone Lives in: House/apartment Stairs: Yes: Internal: 1 flight steps; on left going up and External: 2 steps; on left going up Has following equipment at home: Walker - 2 wheeled and bed side commode  OCCUPATION: Retired; cares for grandchild  PLOF: Independent  PATIENT GOALS: "bend over without hurting"  NEXT MD VISIT: Next Month  OBJECTIVE:  Note: Objective measures were completed at Evaluation unless otherwise noted.  DIAGNOSTIC FINDINGS:    PATIENT SURVEYS:  Modified Oswestry 24/50  12/13/22: Oswestry 22/50   COGNITION: Overall cognitive status: Within functional limits for tasks assessed     SENSATION: WFL   POSTURE:   PALPATION: Increased tension through lumbar paraspinals  LUMBAR ROM:   AROM eval   Flexion 70%  Extension 10%  Right lateral flexion 20%  Left lateral flexion 20%  Right rotation   Left rotation    (Blank rows = not tested)  LOWER EXTREMITY ROM:     Active  Right eval Left eval  Hip flexion    Hip extension    Hip abduction    Hip adduction    Hip internal rotation    Hip external rotation    Knee flexion    Knee extension    Ankle dorsiflexion    Ankle plantarflexion    Ankle inversion    Ankle eversion     (Blank rows = not tested)  LOWER EXTREMITY MMT:    MMT Right eval Left eval Right 12/13/22 Left 12/13/22  Hip flexion 3- 3+ 4/5 4/5  Hip extension 3 3 3+ 3/5  Hip abduction 3+ 3+ 4- 4/5  Hip adduction      Hip internal rotation      Hip external rotation      Knee flexion   4 4  Knee extension 4- 4- 4 4  Ankle dorsiflexion      Ankle plantarflexion      Ankle inversion      Ankle eversion       (Blank rows = not tested)    FUNCTIONAL TESTS:  30 seconds chair stand test:9x 2 minute walk test: 316ft  12/13/22: 446ft 30 seconds chair stand test:10x    GAIT: Distance walked: 310 feet Assistive device utilized: None Level of assistance: Complete Independence Comments: Left lean in trunk with bilateral trendelenberg, R shoulder elevated during ambulation.    TODAY'S TREATMENT:  DATE:  12/25/2022  -Attempted quadruped planks- too much wrist pain -Marjo Bicker pose for lumbar mobility 10x 3' hold -Vector stance 5x5' bilaterally with 1 HHA and no UE support on last set -Single leg standing @ min assist for support 2 x 1' -Tandem stance palloff press w/ red and yellow weighted ball on bosu 2 x 1' -Stiff leg RDL with 10lb BD x 10 with cues at ASIS for hip hinge.   12/20/22 Squat March alternating 5" holds 10x reps Vector stance 5x 5" BLE with 1 HHA Tandem stance on foam 2x 30" Palloff in partial tandem  stance with red weighted ball10x 2 sets Forward lunge onto 4in step with minimal HHA 10x Deadlift mechanics 5# with 5lb bar x 8 with tactile cues at ASIS  12/18/2022  -slow isometric hip extension with 2lb ankle weights -Vector stance 4x 5" BLE with 1 HHA -Attempted sumo deadlift with 5 and 10lb dumbbell-increased LB stress-terminated -Attempted single leg modified deadlift x 10 increased LB pain, terminated -Tandem balance stance on foam pad 3 x 30' -Stepping balance reactions anteriorly x 10 over black line- CGA for balance with one LOB laterally -Deadlift mechanics 5# with 5lb bar x 8 with tactile cues at ASIS.    PATIENT EDUCATION:  Education details: PT Evaluation, findings, prognosis, frequency, attendance policy, and HEP if given.  Person educated: Patient Education method: Medical illustrator Education comprehension: verbalized understanding  HOME EXERCISE PROGRAM: Access Code: [email or web address] URL: [email or web address] Date: 11/16/2022 Prepared by: Starling Manns  Exercises - Supine Lower Trunk Rotation  - 1 x daily - 7 x weekly - 3 sets - 10 reps - Clamshell  - 1 x daily - 7 x weekly - 3 sets - 10 reps - Supine Bridge  - 1 x daily - 7 x weekly - 3 sets - 10 reps  - Single Leg Stance  - 1-2 x daily - 7 x weekly - 1 sets - 5 reps - 30" hold - Standing Tandem Balance with Counter Support  - 1 x daily - 7 x weekly - 1 sets - 3 reps - 30" hold ASSESSMENT:  CLINICAL IMPRESSION: Pt tolerating session well with continued progression of dynamic balancing and core stability activities. Pt tolerating progression well. Continues with need for CGA to min assist for dynamic balancing due to BLE muscle weakness, greater in distal muscles. Pt continues with no increased in pain at EOS. Pt will benefit from skilled Physical Therapy services to address deficits/limitations in order to improve functional and QOL.   OBJECTIVE IMPAIRMENTS: Abnormal gait, decreased balance,  decreased coordination, decreased mobility, decreased ROM, decreased strength, impaired flexibility, and pain.   ACTIVITY LIMITATIONS: carrying, lifting, bending, sitting, standing, squatting, sleeping, transfers, and locomotion level  PARTICIPATION LIMITATIONS: cleaning, laundry, shopping, community activity, occupation, and yard work  PERSONAL FACTORS: Age are also affecting patient's functional outcome.   REHAB POTENTIAL: Good  CLINICAL DECISION MAKING: Stable/uncomplicated  EVALUATION COMPLEXITY: Low   GOALS: Goals reviewed with patient? No  SHORT TERM GOALS: Target date: 12/14/18  Pt will be independent with HEP in order to demonstrate participation in Physical Therapy POC.  Baseline: 12/13/22:  Reports compliance with HEP daily Goal status: MET  2.  Pt will report 3/10 pain with mobility in order to demonstrate improved pain with functional activities.  Baseline: 7/10 worst; 12/13/22 usually 4/10 has increased to 7/10 when taking bath while having to hold leg in air Goal status: IN PROGRESS  LONG TERM GOALS: Target date: 01/11/2023  Pt will  improve 30 second chair test by MCID in order to demonstrate improved functional strength to return to desired activities.  Baseline:  Eval 9 times;  12/13/22 improved to 10 times Goal status: IN PROGRESS  2.  Pt will improve bilateral hip MMT by > 1/2 grade in order to improve pain tolerance during community mobility activities. .  Baseline: see MMT Goal status: IN PROGRESS  3.  Pt will improve Modified Oswestry score by > 5 points in order to demonstrate improved pain with functional goals and outcomes. Baseline: see objective Goal status: IN PROGRESS  4.  Pt will report 2/10 pain with mobility in order to demonstrate reduced pain with overall functional status.  Baseline: see objective Goal status: NOT MET  PLAN:  PT FREQUENCY: 2x/week  PT DURATION: 4 weeks  PLANNED INTERVENTIONS: 97164- PT Re-evaluation,  97110-Therapeutic exercises, 97530- Therapeutic activity, 97112- Neuromuscular re-education, 97535- Self Care, 41660- Manual therapy, [phone]- Gait training, Balance training, and Stair training.  PLAN FOR NEXT SESSION: core stability, BLE strengthening Nelida Meuse PT, DPT Physical Therapist with [facility] [phone] office  Nelida Meuse, PT 12/25/2022, 12:29 PM

## 2023-01-01 ENCOUNTER — Encounter (HOSPITAL_COMMUNITY): Payer: Self-pay

## 2023-01-01 ENCOUNTER — Ambulatory Visit (HOSPITAL_COMMUNITY): Payer: Medicare PPO

## 2023-01-01 DIAGNOSIS — M47816 Spondylosis without myelopathy or radiculopathy, lumbar region: Secondary | ICD-10-CM

## 2023-01-01 DIAGNOSIS — M6281 Muscle weakness (generalized): Secondary | ICD-10-CM

## 2023-01-01 NOTE — Therapy (Signed)
OUTPATIENT PHYSICAL THERAPY THORACOLUMBAR TREATMENT   Patient Name: Casey Armstrong MRN: [id number] DOB:06-Feb-1945, 77 y.o., female Today's Date: 01/01/2023  END OF SESSION:  PT End of Session - 01/01/23 1554     Visit Number 14    Number of Visits 20    Date for PT Re-Evaluation 01/23/23    Authorization Type [employer]    Authorization Time Period 8v from 12/21/22-01/23/23    Authorization - Visit Number 2    Authorization - Number of Visits 8    Progress Note Due on Visit 8    PT Start Time 1515    PT Stop Time 1554    PT Time Calculation (min) 39 min    Activity Tolerance Patient tolerated treatment well    Behavior During Therapy [facility] for tasks assessed/performed                      Past Medical History:  Diagnosis Date   Arthritis    Headache    Lichen sclerosus    Neuromuscular disorder (HCC)    PONV (postoperative nausea and vomiting)    Past Surgical History:  Procedure Laterality Date   CATARACT EXTRACTION W/PHACO  08/15/2010   Procedure: CATARACT EXTRACTION PHACO AND INTRAOCULAR LENS PLACEMENT (IOC);  Surgeon: Gemma Payor;  Location: [facility];  Service: Ophthalmology;  Laterality: Left;  CDE 14.67   CATARACT EXTRACTION W/PHACO  08/25/2010   Procedure: CATARACT EXTRACTION PHACO AND INTRAOCULAR LENS PLACEMENT (IOC);  Surgeon: Gemma Payor;  Location: [facility];  Service: Ophthalmology;  Laterality: Right;  CDE 12.65   COLONOSCOPY  01/30/2011   Procedure: COLONOSCOPY;  Surgeon: Corbin Ade, MD;  Location: [facility];  Service: Endoscopy;  Laterality: N/A;  8:30 AM   DILATION AND CURETTAGE OF UTERUS     30+ yrs ago   EYE SURGERY     left KPE w/ IOL   TONSILLECTOMY     TUBAL LIGATION     Patient Active Problem List   Diagnosis Date Noted   Spondylolisthesis of lumbar region 05/03/2022   Superficial fungus infection of skin 03/03/2020   Vulvar itching 06/05/2016   Lichen sclerosus et atrophicus of the vulva 02/03/2016    PCP: Carylon Perches  MD  REFERRING PROVIDER: Floreen Comber, NP   REFERRING DIAG: [phone] (ICD-10-CM) - Spondylosis without myelopathy or radiculopathy, lumbar region   Rationale for Evaluation and Treatment: Rehabilitation  THERAPY DIAG:  Muscle weakness (generalized)  Spondylosis of lumbar region without myelopathy or radiculopathy  ONSET DATE: 05/03/2022  Next apt date: 12/14/22  SUBJECTIVE:  SUBJECTIVE STATEMENT: Pt continues to report low levels of pain.  PERTINENT HISTORY:  Posterior Fusion   PAIN:  Are you having pain? Yes: NPRS scale: 2/10 Pain location: Bilateral SIJ and spans out laterally Pain description: Achy, sharp, tingles and just "hurts" its just annoying Aggravating factors: long term functional activities Relieving factors: "nothing"  PRECAUTIONS: None  RED FLAGS: None   WEIGHT BEARING RESTRICTIONS: No  FALLS:  Has patient fallen in last 6 months? No  LIVING ENVIRONMENT: Lives with: lives alone Lives in: House/apartment Stairs: Yes: Internal: 1 flight steps; on left going up and External: 2 steps; on left going up Has following equipment at home: Walker - 2 wheeled and bed side commode  OCCUPATION: Retired; cares for grandchild  PLOF: Independent  PATIENT GOALS: "bend over without hurting"  NEXT MD VISIT: Next Month  OBJECTIVE:  Note: Objective measures were completed at Evaluation unless otherwise noted.  DIAGNOSTIC FINDINGS:    PATIENT SURVEYS:  Modified Oswestry 24/50  12/13/22: Oswestry 22/50   COGNITION: Overall cognitive status: Within functional limits for tasks assessed     SENSATION: WFL   POSTURE:   PALPATION: Increased tension through lumbar paraspinals  LUMBAR ROM:   AROM eval  Flexion 70%  Extension 10%  Right lateral flexion 20%  Left  lateral flexion 20%  Right rotation   Left rotation    (Blank rows = not tested)  LOWER EXTREMITY ROM:     Active  Right eval Left eval  Hip flexion    Hip extension    Hip abduction    Hip adduction    Hip internal rotation    Hip external rotation    Knee flexion    Knee extension    Ankle dorsiflexion    Ankle plantarflexion    Ankle inversion    Ankle eversion     (Blank rows = not tested)  LOWER EXTREMITY MMT:    MMT Right eval Left eval Right 12/13/22 Left 12/13/22  Hip flexion 3- 3+ 4/5 4/5  Hip extension 3 3 3+ 3/5  Hip abduction 3+ 3+ 4- 4/5  Hip adduction      Hip internal rotation      Hip external rotation      Knee flexion   4 4  Knee extension 4- 4- 4 4  Ankle dorsiflexion      Ankle plantarflexion      Ankle inversion      Ankle eversion       (Blank rows = not tested)    FUNCTIONAL TESTS:  30 seconds chair stand test:9x 2 minute walk test: 335ft  12/13/22: 477ft 30 seconds chair stand test:10x    GAIT: Distance walked: 310 feet Assistive device utilized: None Level of assistance: Complete Independence Comments: Left lean in trunk with bilateral trendelenberg, R shoulder elevated during ambulation.    TODAY'S TREATMENT:  DATE:  01/01/2023  -3' treadmill ambulation 1.7 speed -Single leg stance 3 x 1' on foam pad -Standing marches shoulder extension with blue theraband 3 x 15  -4in box stepups with blue foam on with unilateral farmer's carry w/ 10lb DB 3 x 5 -Semitandem  bosu stance with palloff press with RTB CGA for balance.  -Modified lunge to blue foam pad with Single UE support on arm chair x 5 bilaterally to pickup timer  12/25/2022  -Attempted quadruped planks- too much wrist pain -Marjo Bicker pose for lumbar mobility 10x 3' hold -Vector stance 5x5' bilaterally with 1 HHA and no UE support on last  set -Single leg standing @ min assist for support 2 x 1' -Tandem stance palloff press w/ red and yellow weighted ball on bosu 2 x 1' -Stiff leg RDL with 10lb BD x 10 with cues at ASIS for hip hinge.   12/20/22 Squat March alternating 5" holds 10x reps Vector stance 5x 5" BLE with 1 HHA Tandem stance on foam 2x 30" Palloff in partial tandem stance with red weighted ball10x 2 sets Forward lunge onto 4in step with minimal HHA 10x Deadlift mechanics 5# with 5lb bar x 8 with tactile cues at ASIS   PATIENT EDUCATION:  Education details: PT Evaluation, findings, prognosis, frequency, attendance policy, and HEP if given.  Person educated: Patient Education method: Medical illustrator Education comprehension: verbalized understanding  HOME EXERCISE PROGRAM: Access Code: [email or web address] URL: [email or web address] Date: 11/16/2022 Prepared by: Starling Manns  Exercises - Supine Lower Trunk Rotation  - 1 x daily - 7 x weekly - 3 sets - 10 reps - Clamshell  - 1 x daily - 7 x weekly - 3 sets - 10 reps - Supine Bridge  - 1 x daily - 7 x weekly - 3 sets - 10 reps  - Single Leg Stance  - 1-2 x daily - 7 x weekly - 1 sets - 5 reps - 30" hold - Standing Tandem Balance with Counter Support  - 1 x daily - 7 x weekly - 1 sets - 3 reps - 30" hold ASSESSMENT:  CLINICAL IMPRESSION: Pt tolerating session well. Continuing to adapt core strengthening activities with dynamic balancing to promote increased safety with ADLs. Pt tolerated new form of ADL modification to reach down for objects on floor, pt is limited with true bending and squat from floor due to lumbar mobility, good carryover with UE supported lunge to ground. Pt will benefit from skilled Physical Therapy services to address deficits/limitations in order to improve functional and QOL.   OBJECTIVE IMPAIRMENTS: Abnormal gait, decreased balance, decreased coordination, decreased mobility, decreased ROM, decreased strength,  impaired flexibility, and pain.   ACTIVITY LIMITATIONS: carrying, lifting, bending, sitting, standing, squatting, sleeping, transfers, and locomotion level  PARTICIPATION LIMITATIONS: cleaning, laundry, shopping, community activity, occupation, and yard work  PERSONAL FACTORS: Age are also affecting patient's functional outcome.   REHAB POTENTIAL: Good  CLINICAL DECISION MAKING: Stable/uncomplicated  EVALUATION COMPLEXITY: Low   GOALS: Goals reviewed with patient? No  SHORT TERM GOALS: Target date: 12/14/18  Pt will be independent with HEP in order to demonstrate participation in Physical Therapy POC.  Baseline: 12/13/22:  Reports compliance with HEP daily Goal status: MET  2.  Pt will report 3/10 pain with mobility in order to demonstrate improved pain with functional activities.  Baseline: 7/10 worst; 12/13/22 usually 4/10 has increased to 7/10 when taking bath while having to hold leg in air Goal status: IN PROGRESS  LONG TERM GOALS:  Target date: 01/11/2023  Pt will improve 30 second chair test by MCID in order to demonstrate improved functional strength to return to desired activities.  Baseline:  Eval 9 times;  12/13/22 improved to 10 times Goal status: IN PROGRESS  2.  Pt will improve bilateral hip MMT by > 1/2 grade in order to improve pain tolerance during community mobility activities. .  Baseline: see MMT Goal status: IN PROGRESS  3.  Pt will improve Modified Oswestry score by > 5 points in order to demonstrate improved pain with functional goals and outcomes. Baseline: see objective Goal status: IN PROGRESS  4.  Pt will report 2/10 pain with mobility in order to demonstrate reduced pain with overall functional status.  Baseline: see objective Goal status: NOT MET  PLAN:  PT FREQUENCY: 2x/week  PT DURATION: 4 weeks  PLANNED INTERVENTIONS: 97164- PT Re-evaluation, 97110-Therapeutic exercises, 97530- Therapeutic activity, 97112- Neuromuscular re-education,  97535- Self Care, 16109- Manual therapy, [phone]- Gait training, Balance training, and Stair training.  PLAN FOR NEXT SESSION: core stability, BLE strengthening  Elie Goody, DPT [facility] [place] [phone] office  Nelida Meuse, PT 01/01/2023, 3:55 PM

## 2023-01-04 ENCOUNTER — Encounter (HOSPITAL_COMMUNITY): Payer: Self-pay

## 2023-01-04 ENCOUNTER — Ambulatory Visit (HOSPITAL_COMMUNITY): Payer: Medicare PPO

## 2023-01-04 DIAGNOSIS — M47816 Spondylosis without myelopathy or radiculopathy, lumbar region: Secondary | ICD-10-CM

## 2023-01-04 DIAGNOSIS — M6281 Muscle weakness (generalized): Secondary | ICD-10-CM

## 2023-01-04 NOTE — Therapy (Signed)
OUTPATIENT PHYSICAL THERAPY THORACOLUMBAR TREATMENT   Patient Name: Casey Armstrong MRN: [id number] DOB:02/16/1945, 77 y.o., female Today's Date: 01/04/2023  END OF SESSION:  PT End of Session - 01/04/23 1339     Visit Number 15    Number of Visits 20    Date for PT Re-Evaluation 01/23/23    Authorization Type [employer]    Authorization Time Period 8v from 12/21/22-01/23/23    Authorization - Visit Number 3    Authorization - Number of Visits 8    Progress Note Due on Visit 8    PT Start Time 1300    PT Stop Time 1338    PT Time Calculation (min) 38 min    Activity Tolerance Patient tolerated treatment well    Behavior During Therapy WFL for tasks assessed/performed              Past Medical History:  Diagnosis Date   Arthritis    Headache    Lichen sclerosus    Neuromuscular disorder (HCC)    PONV (postoperative nausea and vomiting)    Past Surgical History:  Procedure Laterality Date   CATARACT EXTRACTION W/PHACO  08/15/2010   Procedure: CATARACT EXTRACTION PHACO AND INTRAOCULAR LENS PLACEMENT (IOC);  Surgeon: Gemma Payor;  Location: [facility];  Service: Ophthalmology;  Laterality: Left;  CDE 14.67   CATARACT EXTRACTION W/PHACO  08/25/2010   Procedure: CATARACT EXTRACTION PHACO AND INTRAOCULAR LENS PLACEMENT (IOC);  Surgeon: Gemma Payor;  Location: [facility];  Service: Ophthalmology;  Laterality: Right;  CDE 12.65   COLONOSCOPY  01/30/2011   Procedure: COLONOSCOPY;  Surgeon: Corbin Ade, MD;  [facility];  Service: Endoscopy;  Laterality: N/A;  8:30 AM   DILATION AND CURETTAGE OF UTERUS     30+ yrs ago   EYE SURGERY     left KPE w/ IOL   TONSILLECTOMY     TUBAL LIGATION     Patient Active Problem List   Diagnosis Date Noted   Spondylolisthesis of lumbar region 05/03/2022   Superficial fungus infection of skin 03/03/2020   Vulvar itching 06/05/2016   Lichen sclerosus et atrophicus of the vulva 02/03/2016    PCP: Carylon Perches MD  REFERRING PROVIDER:  Floreen Comber, NP   REFERRING DIAG: [phone] (ICD-10-CM) - Spondylosis without myelopathy or radiculopathy, lumbar region   Rationale for Evaluation and Treatment: Rehabilitation  THERAPY DIAG:  Muscle weakness (generalized)  Spondylosis of lumbar region without myelopathy or radiculopathy  ONSET DATE: 05/03/2022  Next apt date: 12/14/22  SUBJECTIVE:  SUBJECTIVE STATEMENT: Pt reporting low levels of pain but increased muscle soreness in BLE after last therapy session.  PERTINENT HISTORY:  Posterior Fusion   PAIN:  Are you having pain? Yes: NPRS scale: 2/10 Pain location: Bilateral SIJ and spans out laterally Pain description: Achy, sharp, tingles and just "hurts" its just annoying Aggravating factors: long term functional activities Relieving factors: "nothing"  PRECAUTIONS: None  RED FLAGS: None   WEIGHT BEARING RESTRICTIONS: No  FALLS:  Has patient fallen in last 6 months? No  LIVING ENVIRONMENT: Lives with: lives alone Lives in: House/apartment Stairs: Yes: Internal: 1 flight steps; on left going up and External: 2 steps; on left going up Has following equipment at home: Walker - 2 wheeled and bed side commode  OCCUPATION: Retired; cares for grandchild  PLOF: Independent  PATIENT GOALS: "bend over without hurting"  NEXT MD VISIT: Next Month  OBJECTIVE:  Note: Objective measures were completed at Evaluation unless otherwise noted.  DIAGNOSTIC FINDINGS:    PATIENT SURVEYS:  Modified Oswestry 24/50  12/13/22: Oswestry 22/50   COGNITION: Overall cognitive status: Within functional limits for tasks assessed     SENSATION: WFL   POSTURE:   PALPATION: Increased tension through lumbar paraspinals  LUMBAR ROM:   AROM eval  Flexion 70%  Extension 10%  Right  lateral flexion 20%  Left lateral flexion 20%  Right rotation   Left rotation    (Blank rows = not tested)  LOWER EXTREMITY ROM:     Active  Right eval Left eval  Hip flexion    Hip extension    Hip abduction    Hip adduction    Hip internal rotation    Hip external rotation    Knee flexion    Knee extension    Ankle dorsiflexion    Ankle plantarflexion    Ankle inversion    Ankle eversion     (Blank rows = not tested)  LOWER EXTREMITY MMT:    MMT Right eval Left eval Right 12/13/22 Left 12/13/22  Hip flexion 3- 3+ 4/5 4/5  Hip extension 3 3 3+ 3/5  Hip abduction 3+ 3+ 4- 4/5  Hip adduction      Hip internal rotation      Hip external rotation      Knee flexion   4 4  Knee extension 4- 4- 4 4  Ankle dorsiflexion      Ankle plantarflexion      Ankle inversion      Ankle eversion       (Blank rows = not tested)    FUNCTIONAL TESTS:  30 seconds chair stand test:9x 2 minute walk test: 358ft  12/13/22: 459ft 30 seconds chair stand test:10x    GAIT: Distance walked: 310 feet Assistive device utilized: None Level of assistance: Complete Independence Comments: Left lean in trunk with bilateral trendelenberg, R shoulder elevated during ambulation.    TODAY'S TREATMENT:  DATE:  01/04/2023  -5' Nustep lvl 3 -GTB standing hip abduction 3 x 30' -4in lateral tap overs 3 x 30' on single leg with hands hovering @ // bars -34ft farmer's carry with unilateral w/ 10lb dumbbell x2 -2in box with hip hike on R side 3 x 30'  01/01/2023  -3' treadmill ambulation 1.7 speed -Single leg stance 3 x 1' on foam pad -Standing marches shoulder extension with blue theraband 3 x 15  -4in box stepups with blue foam on with unilateral farmer's carry w/ 10lb DB 3 x 5 -Semitandem  bosu stance with palloff press with RTB CGA for balance.  -Modified  lunge to blue foam pad with Single UE support on arm chair x 5 bilaterally to pickup timer  12/25/2022  -Attempted quadruped planks- too much wrist pain -Marjo Bicker pose for lumbar mobility 10x 3' hold -Vector stance 5x5' bilaterally with 1 HHA and no UE support on last set -Single leg standing @ min assist for support 2 x 1' -Tandem stance palloff press w/ red and yellow weighted ball on bosu 2 x 1' -Stiff leg RDL with 10lb BD x 10 with cues at ASIS for hip hinge.    PATIENT EDUCATION:  Education details: PT Evaluation, findings, prognosis, frequency, attendance policy, and HEP if given.  Person educated: Patient Education method: Medical illustrator Education comprehension: verbalized understanding  HOME EXERCISE PROGRAM: Access Code: [email or web address] URL: [email or web address] Date: 11/16/2022 Prepared by: Starling Manns  Exercises - Supine Lower Trunk Rotation  - 1 x daily - 7 x weekly - 3 sets - 10 reps - Clamshell  - 1 x daily - 7 x weekly - 3 sets - 10 reps - Supine Bridge  - 1 x daily - 7 x weekly - 3 sets - 10 reps  - Single Leg Stance  - 1-2 x daily - 7 x weekly - 1 sets - 5 reps - 30" hold - Standing Tandem Balance with Counter Support  - 1 x daily - 7 x weekly - 1 sets - 3 reps - 30" hold  Access Code: [email or web address] URL: [email or web address] Date: 01/04/2023 Prepared by: Starling Manns  Exercises - Standing Hip Abduction with Resistance at Ankles and Counter Support  - 1 x daily - 7 x weekly - 3 sets - 10 reps ASSESSMENT:  CLINICAL IMPRESSION: Pt tolerating session well. Increased DOMS reported after last session. Today's session focused on hip abductor strengthening as observable trendelenberg on R side is more noticeable this session. Pt continues to tolerate progressions of core stability during interventions well. Pt will benefit from skilled Physical Therapy services to address deficits/limitations in order to improve functional and QOL.    OBJECTIVE IMPAIRMENTS: Abnormal gait, decreased balance, decreased coordination, decreased mobility, decreased ROM, decreased strength, impaired flexibility, and pain.   ACTIVITY LIMITATIONS: carrying, lifting, bending, sitting, standing, squatting, sleeping, transfers, and locomotion level  PARTICIPATION LIMITATIONS: cleaning, laundry, shopping, community activity, occupation, and yard work  PERSONAL FACTORS: Age are also affecting patient's functional outcome.   REHAB POTENTIAL: Good  CLINICAL DECISION MAKING: Stable/uncomplicated  EVALUATION COMPLEXITY: Low   GOALS: Goals reviewed with patient? No  SHORT TERM GOALS: Target date: 12/14/18  Pt will be independent with HEP in order to demonstrate participation in Physical Therapy POC.  Baseline: 12/13/22:  Reports compliance with HEP daily Goal status: MET  2.  Pt will report 3/10 pain with mobility in order to demonstrate improved pain with functional activities.  Baseline: 7/10 worst; 12/13/22 usually 4/10 has increased to 7/10  when taking bath while having to hold leg in air Goal status: IN PROGRESS  LONG TERM GOALS: Target date: 01/11/2023  Pt will improve 30 second chair test by MCID in order to demonstrate improved functional strength to return to desired activities.  Baseline:  Eval 9 times;  12/13/22 improved to 10 times Goal status: IN PROGRESS  2.  Pt will improve bilateral hip MMT by > 1/2 grade in order to improve pain tolerance during community mobility activities. .  Baseline: see MMT Goal status: IN PROGRESS  3.  Pt will improve Modified Oswestry score by > 5 points in order to demonstrate improved pain with functional goals and outcomes. Baseline: see objective Goal status: IN PROGRESS  4.  Pt will report 2/10 pain with mobility in order to demonstrate reduced pain with overall functional status.  Baseline: see objective Goal status: NOT MET  PLAN:  PT FREQUENCY: 2x/week  PT DURATION: 4  weeks  PLANNED INTERVENTIONS: 97164- PT Re-evaluation, 97110-Therapeutic exercises, 97530- Therapeutic activity, 97112- Neuromuscular re-education, 97535- Self Care, 01027- Manual therapy, [phone]- Gait training, Balance training, and Stair training.  PLAN FOR NEXT SESSION: core stability, BLE strengthening  Elie Goody, DPT [facility] [place] [phone] office  Nelida Meuse, PT 01/04/2023, 1:39 PM

## 2023-01-08 ENCOUNTER — Ambulatory Visit (HOSPITAL_COMMUNITY): Payer: Medicare PPO

## 2023-01-08 ENCOUNTER — Encounter (HOSPITAL_COMMUNITY): Payer: Self-pay

## 2023-01-08 DIAGNOSIS — M47816 Spondylosis without myelopathy or radiculopathy, lumbar region: Secondary | ICD-10-CM

## 2023-01-08 DIAGNOSIS — M6281 Muscle weakness (generalized): Secondary | ICD-10-CM | POA: Diagnosis not present

## 2023-01-08 NOTE — Therapy (Signed)
OUTPATIENT PHYSICAL THERAPY THORACOLUMBAR TREATMENT   Patient Name: Casey Armstrong MRN: [id number] DOB:09/05/1945, 77 y.o., female Today's Date: 01/08/2023  END OF SESSION:  PT End of Session - 01/08/23 1227     Visit Number 16    Number of Visits 20    Date for PT Re-Evaluation 01/23/23    Authorization Type [employer]    Authorization Time Period 8v from 12/21/22-01/23/23    Authorization - Visit Number 4    Progress Note Due on Visit 8    PT Start Time 1145    PT Stop Time 1225    PT Time Calculation (min) 40 min    Activity Tolerance Patient tolerated treatment well    Behavior During Therapy [facility] for tasks assessed/performed               Past Medical History:  Diagnosis Date   Arthritis    Headache    Lichen sclerosus    Neuromuscular disorder (HCC)    PONV (postoperative nausea and vomiting)    Past Surgical History:  Procedure Laterality Date   CATARACT EXTRACTION W/PHACO  08/15/2010   Procedure: CATARACT EXTRACTION PHACO AND INTRAOCULAR LENS PLACEMENT (IOC);  Surgeon: Gemma Payor;  Location: [facility];  Service: Ophthalmology;  Laterality: Left;  CDE 14.67   CATARACT EXTRACTION W/PHACO  08/25/2010   Procedure: CATARACT EXTRACTION PHACO AND INTRAOCULAR LENS PLACEMENT (IOC);  Surgeon: Gemma Payor;  Location: [facility];  Service: Ophthalmology;  Laterality: Right;  CDE 12.65   COLONOSCOPY  01/30/2011   Procedure: COLONOSCOPY;  Surgeon: Corbin Ade, MD;  Location: [facility];  Service: Endoscopy;  Laterality: N/A;  8:30 AM   DILATION AND CURETTAGE OF UTERUS     30+ yrs ago   EYE SURGERY     left KPE w/ IOL   TONSILLECTOMY     TUBAL LIGATION     Patient Active Problem List   Diagnosis Date Noted   Spondylolisthesis of lumbar region 05/03/2022   Superficial fungus infection of skin 03/03/2020   Vulvar itching 06/05/2016   Lichen sclerosus et atrophicus of the vulva 02/03/2016    PCP: Carylon Perches MD  REFERRING PROVIDER: Floreen Comber, NP   REFERRING  DIAG: [phone] (ICD-10-CM) - Spondylosis without myelopathy or radiculopathy, lumbar region   Rationale for Evaluation and Treatment: Rehabilitation  THERAPY DIAG:  Muscle weakness (generalized)  Spondylosis of lumbar region without myelopathy or radiculopathy  ONSET DATE: 05/03/2022  Next apt date: 12/14/22  SUBJECTIVE:  SUBJECTIVE STATEMENT: Reporting 1-1.5 /10 pain.  PERTINENT HISTORY:  Posterior Fusion   PAIN:  Are you having pain? Yes: NPRS scale: 2/10 Pain location: Bilateral SIJ and spans out laterally Pain description: Achy, sharp, tingles and just "hurts" its just annoying Aggravating factors: long term functional activities Relieving factors: "nothing"  PRECAUTIONS: None  RED FLAGS: None   WEIGHT BEARING RESTRICTIONS: No  FALLS:  Has patient fallen in last 6 months? No  LIVING ENVIRONMENT: Lives with: lives alone Lives in: House/apartment Stairs: Yes: Internal: 1 flight steps; on left going up and External: 2 steps; on left going up Has following equipment at home: Walker - 2 wheeled and bed side commode  OCCUPATION: Retired; cares for grandchild  PLOF: Independent  PATIENT GOALS: "bend over without hurting"  NEXT MD VISIT: Next Month  OBJECTIVE:  Note: Objective measures were completed at Evaluation unless otherwise noted.  DIAGNOSTIC FINDINGS:    PATIENT SURVEYS:  Modified Oswestry 24/50  12/13/22: Oswestry 22/50   COGNITION: Overall cognitive status: Within functional limits for tasks assessed     SENSATION: WFL   POSTURE:   PALPATION: Increased tension through lumbar paraspinals  LUMBAR ROM:   AROM eval  Flexion 70%  Extension 10%  Right lateral flexion 20%  Left lateral flexion 20%  Right rotation   Left rotation    (Blank rows = not  tested)  LOWER EXTREMITY ROM:     Active  Right eval Left eval  Hip flexion    Hip extension    Hip abduction    Hip adduction    Hip internal rotation    Hip external rotation    Knee flexion    Knee extension    Ankle dorsiflexion    Ankle plantarflexion    Ankle inversion    Ankle eversion     (Blank rows = not tested)  LOWER EXTREMITY MMT:    MMT Right eval Left eval Right 12/13/22 Left 12/13/22  Hip flexion 3- 3+ 4/5 4/5  Hip extension 3 3 3+ 3/5  Hip abduction 3+ 3+ 4- 4/5  Hip adduction      Hip internal rotation      Hip external rotation      Knee flexion   4 4  Knee extension 4- 4- 4 4  Ankle dorsiflexion      Ankle plantarflexion      Ankle inversion      Ankle eversion       (Blank rows = not tested)    FUNCTIONAL TESTS:  30 seconds chair stand test:9x 2 minute walk test: 326ft  12/13/22: 434ft 30 seconds chair stand test:10x    GAIT: Distance walked: 310 feet Assistive device utilized: None Level of assistance: Complete Independence Comments: Left lean in trunk with bilateral trendelenberg, R shoulder elevated during ambulation.    TODAY'S TREATMENT:  DATE:  01/08/2023  -5' Nustep lvl 3 seat 7 -Tandem stance on BOSU dome up palloff press GTB 2 x 15 press -Single leg balance 2x1' -Bilateral narrow stance balance on bosu dome up with TA bracing BUE support on // bars intermittently 3 x 1' -3x5 stepup bosu up single UE on // bars -x8 sumo deadlift with 15lb   01/04/2023  -5' Nustep lvl 3 -GTB standing hip abduction 3 x 30' -4in lateral tap overs 3 x 30' on single leg with hands hovering @ // bars -107ft farmer's carry with unilateral w/ 10lb dumbbell x2 -2in box with hip hike on R side 3 x 30'  01/01/2023  -3' treadmill ambulation 1.7 speed -Single leg stance 3 x 1' on foam pad -Standing marches shoulder  extension with blue theraband 3 x 15  -4in box stepups with blue foam on with unilateral farmer's carry w/ 10lb DB 3 x 5 -Semitandem  bosu stance with palloff press with RTB CGA for balance.  -Modified lunge to blue foam pad with Single UE support on arm chair x 5 bilaterally to pickup timer  PATIENT EDUCATION:  Education details: PT Evaluation, findings, prognosis, frequency, attendance policy, and HEP if given.  Person educated: Patient Education method: Medical illustrator Education comprehension: verbalized understanding  HOME EXERCISE PROGRAM: Access Code: [email or web address] URL: [email or web address] Date: 11/16/2022 Prepared by: Starling Manns  Exercises - Supine Lower Trunk Rotation  - 1 x daily - 7 x weekly - 3 sets - 10 reps - Clamshell  - 1 x daily - 7 x weekly - 3 sets - 10 reps - Supine Bridge  - 1 x daily - 7 x weekly - 3 sets - 10 reps  - Single Leg Stance  - 1-2 x daily - 7 x weekly - 1 sets - 5 reps - 30" hold - Standing Tandem Balance with Counter Support  - 1 x daily - 7 x weekly - 1 sets - 3 reps - 30" hold  Access Code: [email or web address] URL: [email or web address] Date: 01/04/2023 Prepared by: Starling Manns  Exercises - Standing Hip Abduction with Resistance at Ankles and Counter Support  - 1 x daily - 7 x weekly - 3 sets - 10 reps ASSESSMENT:  CLINICAL IMPRESSION: Pt tolerated session well with continual focus on dynamic balance and strengthening while promoting core stability and TA bracing. Pt continues to show improvement with tolerance and changes to program. Pt approaching DC at end of this extended POC. Pt showing consistent core stability with no break in form during deadlifts this session. Pt will benefit from skilled Physical Therapy services to address deficits/limitations in order to improve functional and QOL.   OBJECTIVE IMPAIRMENTS: Abnormal gait, decreased balance, decreased coordination, decreased mobility, decreased ROM,  decreased strength, impaired flexibility, and pain.   ACTIVITY LIMITATIONS: carrying, lifting, bending, sitting, standing, squatting, sleeping, transfers, and locomotion level  PARTICIPATION LIMITATIONS: cleaning, laundry, shopping, community activity, occupation, and yard work  PERSONAL FACTORS: Age are also affecting patient's functional outcome.   REHAB POTENTIAL: Good  CLINICAL DECISION MAKING: Stable/uncomplicated  EVALUATION COMPLEXITY: Low   GOALS: Goals reviewed with patient? No  SHORT TERM GOALS: Target date: 12/14/18  Pt will be independent with HEP in order to demonstrate participation in Physical Therapy POC.  Baseline: 12/13/22:  Reports compliance with HEP daily Goal status: MET  2.  Pt will report 3/10 pain with mobility in order to demonstrate improved pain with functional activities.  Baseline: 7/10 worst; 12/13/22 usually 4/10 has increased to 7/10  when taking bath while having to hold leg in air Goal status: IN PROGRESS  LONG TERM GOALS: Target date: 01/11/2023  Pt will improve 30 second chair test by MCID in order to demonstrate improved functional strength to return to desired activities.  Baseline:  Eval 9 times;  12/13/22 improved to 10 times Goal status: IN PROGRESS  2.  Pt will improve bilateral hip MMT by > 1/2 grade in order to improve pain tolerance during community mobility activities. .  Baseline: see MMT Goal status: IN PROGRESS  3.  Pt will improve Modified Oswestry score by > 5 points in order to demonstrate improved pain with functional goals and outcomes. Baseline: see objective Goal status: IN PROGRESS  4.  Pt will report 2/10 pain with mobility in order to demonstrate reduced pain with overall functional status.  Baseline: see objective Goal status: NOT MET  PLAN:  PT FREQUENCY: 2x/week  PT DURATION: 4 weeks  PLANNED INTERVENTIONS: 97164- PT Re-evaluation, 97110-Therapeutic exercises, 97530- Therapeutic activity, 97112-  Neuromuscular re-education, 97535- Self Care, 95284- Manual therapy, [phone]- Gait training, Balance training, and Stair training.  PLAN FOR NEXT SESSION: core stability, BLE strengthening  Elie Goody, DPT [facility] [place] [phone] office  Nelida Meuse, PT 01/08/2023, 12:27 PM

## 2023-01-10 ENCOUNTER — Encounter (HOSPITAL_COMMUNITY): Payer: Self-pay

## 2023-01-10 ENCOUNTER — Ambulatory Visit (HOSPITAL_COMMUNITY): Payer: Medicare PPO

## 2023-01-10 DIAGNOSIS — M47816 Spondylosis without myelopathy or radiculopathy, lumbar region: Secondary | ICD-10-CM | POA: Diagnosis not present

## 2023-01-10 DIAGNOSIS — M6281 Muscle weakness (generalized): Secondary | ICD-10-CM | POA: Diagnosis not present

## 2023-01-10 NOTE — Therapy (Signed)
OUTPATIENT PHYSICAL THERAPY THORACOLUMBAR TREATMENT   Patient Name: MASAYE DOWNES MRN: [id number] DOB:[birth year], 77 y.o., female Today's Date: 01/10/2023  END OF SESSION:  PT End of Session - 01/10/23 1140     Visit Number 17    Number of Visits 20    Date for PT Re-Evaluation 01/23/23    Authorization Type [employer]    Authorization Time Period 8v from 12/21/22-01/23/23    Authorization - Visit Number 5    Authorization - Number of Visits 8    Progress Note Due on Visit 8    PT Start Time 1142    PT Stop Time 1225    PT Time Calculation (min) 43 min    Activity Tolerance Patient tolerated treatment well    Behavior During Therapy WFL for tasks assessed/performed               Past Medical History:  Diagnosis Date   Arthritis    Headache    Lichen sclerosus    Neuromuscular disorder (HCC)    PONV (postoperative nausea and vomiting)    Past Surgical History:  Procedure Laterality Date   CATARACT EXTRACTION W/PHACO  08/15/2010   Procedure: CATARACT EXTRACTION PHACO AND INTRAOCULAR LENS PLACEMENT (IOC);  Surgeon: Gemma Payor;  Location: [facility];  Service: Ophthalmology;  Laterality: Left;  CDE 14.67   CATARACT EXTRACTION W/PHACO  08/25/2010   Procedure: CATARACT EXTRACTION PHACO AND INTRAOCULAR LENS PLACEMENT (IOC);  Surgeon: Gemma Payor;  Location: [facility];  Service: Ophthalmology;  Laterality: Right;  CDE 12.65   COLONOSCOPY  01/30/2011   Procedure: COLONOSCOPY;  Surgeon: Corbin Ade, MD;  [facility];  Service: Endoscopy;  Laterality: N/A;  8:30 AM   DILATION AND CURETTAGE OF UTERUS     30+ yrs ago   EYE SURGERY     left KPE w/ IOL   TONSILLECTOMY     TUBAL LIGATION     Patient Active Problem List   Diagnosis Date Noted   Spondylolisthesis of lumbar region 05/03/2022   Superficial fungus infection of skin 03/03/2020   Vulvar itching 06/05/2016   Lichen sclerosus et atrophicus of the vulva 02/03/2016    PCP: Carylon Perches MD  REFERRING  PROVIDER: Floreen Comber, NP   REFERRING DIAG: [phone] (ICD-10-CM) - Spondylosis without myelopathy or radiculopathy, lumbar region   Rationale for Evaluation and Treatment: Rehabilitation  THERAPY DIAG:  Muscle weakness (generalized)  Spondylosis of lumbar region without myelopathy or radiculopathy  ONSET DATE: 05/03/2022  Next apt date: 12/14/22  SUBJECTIVE:  SUBJECTIVE STATEMENT: Reports she was sore for 24hrs following last session.  Feeling good today, no reports of pain currently.    PERTINENT HISTORY:  Posterior Fusion   PAIN:  Are you having pain? Yes: NPRS scale: 0/10 Pain location: Bilateral SIJ and spans out laterally Pain description: Achy, sharp, tingles and just "hurts" its just annoying Aggravating factors: long term functional activities Relieving factors: "nothing"  PRECAUTIONS: None  RED FLAGS: None   WEIGHT BEARING RESTRICTIONS: No  FALLS:  Has patient fallen in last 6 months? No  LIVING ENVIRONMENT: Lives with: lives alone Lives in: House/apartment Stairs: Yes: Internal: 1 flight steps; on left going up and External: 2 steps; on left going up Has following equipment at home: Walker - 2 wheeled and bed side commode  OCCUPATION: Retired; cares for grandchild  PLOF: Independent  PATIENT GOALS: "bend over without hurting"  NEXT MD VISIT: Next Month  OBJECTIVE:  Note: Objective measures were completed at Evaluation unless otherwise noted.  DIAGNOSTIC FINDINGS:    PATIENT SURVEYS:  Modified Oswestry 24/50  12/13/22: Oswestry 22/50   COGNITION: Overall cognitive status: Within functional limits for tasks assessed     SENSATION: WFL   POSTURE:   PALPATION: Increased tension through lumbar paraspinals  LUMBAR ROM:   AROM eval  Flexion 70%   Extension 10%  Right lateral flexion 20%  Left lateral flexion 20%  Right rotation   Left rotation    (Blank rows = not tested)  LOWER EXTREMITY ROM:     Active  Right eval Left eval  Hip flexion    Hip extension    Hip abduction    Hip adduction    Hip internal rotation    Hip external rotation    Knee flexion    Knee extension    Ankle dorsiflexion    Ankle plantarflexion    Ankle inversion    Ankle eversion     (Blank rows = not tested)  LOWER EXTREMITY MMT:    MMT Right eval Left eval Right 12/13/22 Left 12/13/22  Hip flexion 3- 3+ 4/5 4/5  Hip extension 3 3 3+ 3/5  Hip abduction 3+ 3+ 4- 4/5  Hip adduction      Hip internal rotation      Hip external rotation      Knee flexion   4 4  Knee extension 4- 4- 4 4  Ankle dorsiflexion      Ankle plantarflexion      Ankle inversion      Ankle eversion       (Blank rows = not tested)    FUNCTIONAL TESTS:  30 seconds chair stand test:9x 2 minute walk test: 373ft  12/13/22: 459ft 30 seconds chair stand test:10x    GAIT: Distance walked: 310 feet Assistive device utilized: None Level of assistance: Complete Independence Comments: Left lean in trunk with bilateral trendelenberg, R shoulder elevated during ambulation.    TODAY'S TREATMENT:  DATE:   01/10/23: -5' Nustep lvl 3 seat 7 -20ft farmer's carry with unilateral w/ 10lb dumbbell x2 -Proper lifting 10# in 8#  box total of 18# 2sets 5 reps -SLS Lt 5" Rt  2" max -Vector 5x 5" with 1 finger A Tandem on foam 2x 30" intermittent HHA -Retrogait with bodycraft 20# 8x -x8 sumo deadlift with 15lb    01/08/2023  -5' Nustep lvl 3 seat 7 -Tandem stance on BOSU dome up palloff press GTB 2 x 15 press -Single leg balance 2x1' -Bilateral narrow stance balance on bosu dome up with TA bracing BUE support on // bars  intermittently 3 x 1' -3x5 stepup bosu up single UE on // bars -x8 sumo deadlift with 15lb   01/04/2023  -5' Nustep lvl 3 -GTB standing hip abduction 3 x 30' -4in lateral tap overs 3 x 30' on single leg with hands hovering @ // bars -45ft farmer's carry with unilateral w/ 10lb dumbbell x2 -2in box with hip hike on R side 3 x 30'  01/01/2023  -3' treadmill ambulation 1.7 speed -Single leg stance 3 x 1' on foam pad -Standing marches shoulder extension with blue theraband 3 x 15  -4in box stepups with blue foam on with unilateral farmer's carry w/ 10lb DB 3 x 5 -Semitandem  bosu stance with palloff press with RTB CGA for balance.  -Modified lunge to blue foam pad with Single UE support on arm chair x 5 bilaterally to pickup timer  PATIENT EDUCATION:  Education details: PT Evaluation, findings, prognosis, frequency, attendance policy, and HEP if given.  Person educated: Patient Education method: Medical illustrator Education comprehension: verbalized understanding  HOME EXERCISE PROGRAM: Access Code: [email or web address] URL: [email or web address] Date: 11/16/2022 Prepared by: Starling Manns  Exercises - Supine Lower Trunk Rotation  - 1 x daily - 7 x weekly - 3 sets - 10 reps - Clamshell  - 1 x daily - 7 x weekly - 3 sets - 10 reps - Supine Bridge  - 1 x daily - 7 x weekly - 3 sets - 10 reps  - Single Leg Stance  - 1-2 x daily - 7 x weekly - 1 sets - 5 reps - 30" hold - Standing Tandem Balance with Counter Support  - 1 x daily - 7 x weekly - 1 sets - 3 reps - 30" hold  Access Code: [email or web address] URL: [email or web address] Date: 01/04/2023 Prepared by: Starling Manns  Exercises - Standing Hip Abduction with Resistance at Ankles and Counter Support  - 1 x daily - 7 x weekly - 3 sets - 10 reps ASSESSMENT:  CLINICAL IMPRESSION: Continued session focus with core stability and TrA bracing during dynamic balance and functional strengthening.  Pt presents with  improved mechanics with proper lifting mechanics.  Pt able to complete whole session with no reports of increased pain and minimal fatigue with activities.    OBJECTIVE IMPAIRMENTS: Abnormal gait, decreased balance, decreased coordination, decreased mobility, decreased ROM, decreased strength, impaired flexibility, and pain.   ACTIVITY LIMITATIONS: carrying, lifting, bending, sitting, standing, squatting, sleeping, transfers, and locomotion level  PARTICIPATION LIMITATIONS: cleaning, laundry, shopping, community activity, occupation, and yard work  PERSONAL FACTORS: Age are also affecting patient's functional outcome.   REHAB POTENTIAL: Good  CLINICAL DECISION MAKING: Stable/uncomplicated  EVALUATION COMPLEXITY: Low   GOALS: Goals reviewed with patient? No  SHORT TERM GOALS: Target date: 12/14/18  Pt will be independent with HEP in order to demonstrate participation in Physical Therapy POC.  Baseline: 12/13/22:  Reports  compliance with HEP daily Goal status: MET  2.  Pt will report 3/10 pain with mobility in order to demonstrate improved pain with functional activities.  Baseline: 7/10 worst; 12/13/22 usually 4/10 has increased to 7/10 when taking bath while having to hold leg in air Goal status: IN PROGRESS  LONG TERM GOALS: Target date: 01/11/2023  Pt will improve 30 second chair test by MCID in order to demonstrate improved functional strength to return to desired activities.  Baseline:  Eval 9 times;  12/13/22 improved to 10 times Goal status: IN PROGRESS  2.  Pt will improve bilateral hip MMT by > 1/2 grade in order to improve pain tolerance during community mobility activities. .  Baseline: see MMT Goal status: IN PROGRESS  3.  Pt will improve Modified Oswestry score by > 5 points in order to demonstrate improved pain with functional goals and outcomes. Baseline: see objective Goal status: IN PROGRESS  4.  Pt will report 2/10 pain with mobility in order to  demonstrate reduced pain with overall functional status.  Baseline: see objective Goal status: NOT MET  PLAN:  PT FREQUENCY: 2x/week  PT DURATION: 4 weeks  PLANNED INTERVENTIONS: 97164- PT Re-evaluation, 97110-Therapeutic exercises, 97530- Therapeutic activity, 97112- Neuromuscular re-education, 97535- Self Care, 27253- Manual therapy, [phone]- Gait training, Balance training, and Stair training.  PLAN FOR NEXT SESSION: core stability, BLE strengthening  Becky Sax, LPTA/CLT; CBIS [phone]  Juel Burrow, PTA 01/10/2023, 1:03 PM

## 2023-01-19 ENCOUNTER — Encounter (HOSPITAL_COMMUNITY): Payer: Self-pay

## 2023-01-19 ENCOUNTER — Ambulatory Visit (HOSPITAL_COMMUNITY): Payer: Medicare PPO

## 2023-01-19 DIAGNOSIS — M6281 Muscle weakness (generalized): Secondary | ICD-10-CM

## 2023-01-19 DIAGNOSIS — M47816 Spondylosis without myelopathy or radiculopathy, lumbar region: Secondary | ICD-10-CM | POA: Diagnosis not present

## 2023-01-19 NOTE — Therapy (Signed)
OUTPATIENT PHYSICAL THERAPY THORACOLUMBAR TREATMENT   Patient Name: Casey Armstrong MRN: [id number] DOB:[birth year], 77 y.o., female Today's Date: 01/19/2023  END OF SESSION:  PT End of Session - 01/19/23 1430     Visit Number 18    Number of Visits 20    Date for PT Re-Evaluation 01/23/23    Authorization Type [employer]    Authorization Time Period 8v from 12/21/22-01/23/23    Authorization - Visit Number 6    Authorization - Number of Visits 8    Progress Note Due on Visit 8    PT Start Time 1345    PT Stop Time 1427    PT Time Calculation (min) 42 min    Activity Tolerance Patient tolerated treatment well    Behavior During Therapy WFL for tasks assessed/performed                Past Medical History:  Diagnosis Date   Arthritis    Headache    Lichen sclerosus    Neuromuscular disorder (HCC)    PONV (postoperative nausea and vomiting)    Past Surgical History:  Procedure Laterality Date   CATARACT EXTRACTION W/PHACO  08/15/2010   Procedure: CATARACT EXTRACTION PHACO AND INTRAOCULAR LENS PLACEMENT (IOC);  Surgeon: Gemma Payor;  Location: [facility];  Service: Ophthalmology;  Laterality: Left;  CDE 14.67   CATARACT EXTRACTION W/PHACO  08/25/2010   Procedure: CATARACT EXTRACTION PHACO AND INTRAOCULAR LENS PLACEMENT (IOC);  Surgeon: Gemma Payor;  Location: [facility];  Service: Ophthalmology;  Laterality: Right;  CDE 12.65   COLONOSCOPY  01/30/2011   Procedure: COLONOSCOPY;  Surgeon: Corbin Ade, MD;  Location: [facility];  Service: Endoscopy;  Laterality: N/A;  8:30 AM   DILATION AND CURETTAGE OF UTERUS     30+ yrs ago   EYE SURGERY     left KPE w/ IOL   TONSILLECTOMY     TUBAL LIGATION     Patient Active Problem List   Diagnosis Date Noted   Spondylolisthesis of lumbar region 05/03/2022   Superficial fungus infection of skin 03/03/2020   Vulvar itching 06/05/2016   Lichen sclerosus et atrophicus of the vulva 02/03/2016    PCP: Carylon Perches MD  REFERRING  PROVIDER: Floreen Comber, NP   REFERRING DIAG: [phone] (ICD-10-CM) - Spondylosis without myelopathy or radiculopathy, lumbar region   Rationale for Evaluation and Treatment: Rehabilitation  THERAPY DIAG:  Muscle weakness (generalized)  Spondylosis of lumbar region without myelopathy or radiculopathy  ONSET DATE: 05/03/2022  Next apt date: 12/14/22  SUBJECTIVE:  SUBJECTIVE STATEMENT: Feeling good today, no reports of pain currently.  Pt reports her Right knee is hurting but think its related to cold weather.  PERTINENT HISTORY:  Posterior Fusion   PAIN:  Are you having pain? Yes: NPRS scale: 0/10 Pain location: Bilateral SIJ and spans out laterally Pain description: Achy, sharp, tingles and just "hurts" its just annoying Aggravating factors: long term functional activities Relieving factors: "nothing"  PRECAUTIONS: None  RED FLAGS: None   WEIGHT BEARING RESTRICTIONS: No  FALLS:  Has patient fallen in last 6 months? No  LIVING ENVIRONMENT: Lives with: lives alone Lives in: House/apartment Stairs: Yes: Internal: 1 flight steps; on left going up and External: 2 steps; on left going up Has following equipment at home: Walker - 2 wheeled and bed side commode  OCCUPATION: Retired; cares for grandchild  PLOF: Independent  PATIENT GOALS: "bend over without hurting"  NEXT MD VISIT: Next Month  OBJECTIVE:  Note: Objective measures were completed at Evaluation unless otherwise noted.  DIAGNOSTIC FINDINGS:    PATIENT SURVEYS:  Modified Oswestry 24/50  12/13/22: Oswestry 22/50   COGNITION: Overall cognitive status: Within functional limits for tasks assessed     SENSATION: WFL   POSTURE:   PALPATION: Increased tension through lumbar paraspinals  LUMBAR ROM:   AROM eval   Flexion 70%  Extension 10%  Right lateral flexion 20%  Left lateral flexion 20%  Right rotation   Left rotation    (Blank rows = not tested)  LOWER EXTREMITY ROM:     Active  Right eval Left eval  Hip flexion    Hip extension    Hip abduction    Hip adduction    Hip internal rotation    Hip external rotation    Knee flexion    Knee extension    Ankle dorsiflexion    Ankle plantarflexion    Ankle inversion    Ankle eversion     (Blank rows = not tested)  LOWER EXTREMITY MMT:    MMT Right eval Left eval Right 12/13/22 Left 12/13/22  Hip flexion 3- 3+ 4/5 4/5  Hip extension 3 3 3+ 3/5  Hip abduction 3+ 3+ 4- 4/5  Hip adduction      Hip internal rotation      Hip external rotation      Knee flexion   4 4  Knee extension 4- 4- 4 4  Ankle dorsiflexion      Ankle plantarflexion      Ankle inversion      Ankle eversion       (Blank rows = not tested)    FUNCTIONAL TESTS:  30 seconds chair stand test:9x 2 minute walk test: 341ft  12/13/22: 442ft 30 seconds chair stand test:10x    GAIT: Distance walked: 310 feet Assistive device utilized: None Level of assistance: Complete Independence Comments: Left lean in trunk with bilateral trendelenberg, R shoulder elevated during ambulation.    TODAY'S TREATMENT:  DATE:  01/19/2023  -Tandem stance 3x 1' with tidal tank in elbow flexion hold bilaterally -40ft x 3 with tidal tank carrying in elbow flexion hold-core stabilization with water perturbations -8x sit/stand transfers with tidal tank- supervision level   01/10/23: -5' Nustep lvl 3 seat 7 -36ft farmer's carry with unilateral w/ 10lb dumbbell x2 -Proper lifting 10# in 8#  box total of 18# 2sets 5 reps -SLS Lt 5" Rt  2" max -Vector 5x 5" with 1 finger A Tandem on foam 2x 30" intermittent HHA -Retrogait with bodycraft 20#  8x -x8 sumo deadlift with 15lb    01/08/2023  -5' Nustep lvl 3 seat 7 -Tandem stance on BOSU dome up palloff press GTB 2 x 15 press -Single leg balance 2x1' -Bilateral narrow stance balance on bosu dome up with TA bracing BUE support on // bars intermittently 3 x 1' -3x5 stepup bosu up single UE on // bars -x8 sumo deadlift with 15lb   PATIENT EDUCATION:  Education details: PT Evaluation, findings, prognosis, frequency, attendance policy, and HEP if given.  Person educated: Patient Education method: Medical illustrator Education comprehension: verbalized understanding  HOME EXERCISE PROGRAM: Access Code: [email or web address] URL: [email or web address] Date: 11/16/2022 Prepared by: Starling Manns  Exercises - Supine Lower Trunk Rotation  - 1 x daily - 7 x weekly - 3 sets - 10 reps - Clamshell  - 1 x daily - 7 x weekly - 3 sets - 10 reps - Supine Bridge  - 1 x daily - 7 x weekly - 3 sets - 10 reps  - Single Leg Stance  - 1-2 x daily - 7 x weekly - 1 sets - 5 reps - 30" hold - Standing Tandem Balance with Counter Support  - 1 x daily - 7 x weekly - 1 sets - 3 reps - 30" hold  Access Code: [email or web address] URL: [email or web address] Date: 01/04/2023 Prepared by: Starling Manns  Exercises - Standing Hip Abduction with Resistance at Ankles and Counter Support  - 1 x daily - 7 x weekly - 3 sets - 10 reps ASSESSMENT:  CLINICAL IMPRESSION: Today's session focused on core stability with self perturbations with use of dynamic water/tidal tank device. Pt was cued tactilly and verbally for core stability during functional activities carrying and lifting. Pt with increased Low back soreness after use of tidal tank during interventions this date.. Pt will benefit from skilled Physical Therapy services to address deficits/limitations in order to improve functional and QOL.     OBJECTIVE IMPAIRMENTS: Abnormal gait, decreased balance, decreased coordination, decreased  mobility, decreased ROM, decreased strength, impaired flexibility, and pain.   ACTIVITY LIMITATIONS: carrying, lifting, bending, sitting, standing, squatting, sleeping, transfers, and locomotion level  PARTICIPATION LIMITATIONS: cleaning, laundry, shopping, community activity, occupation, and yard work  PERSONAL FACTORS: Age are also affecting patient's functional outcome.   REHAB POTENTIAL: Good  CLINICAL DECISION MAKING: Stable/uncomplicated  EVALUATION COMPLEXITY: Low   GOALS: Goals reviewed with patient? No  SHORT TERM GOALS: Target date: 12/14/18  Pt will be independent with HEP in order to demonstrate participation in Physical Therapy POC.  Baseline: 12/13/22:  Reports compliance with HEP daily Goal status: MET  2.  Pt will report 3/10 pain with mobility in order to demonstrate improved pain with functional activities.  Baseline: 7/10 worst; 12/13/22 usually 4/10 has increased to 7/10 when taking bath while having to hold leg in air Goal status: IN PROGRESS  LONG TERM GOALS: Target date: 01/11/2023  Pt will improve 30 second chair test by  MCID in order to demonstrate improved functional strength to return to desired activities.  Baseline:  Eval 9 times;  12/13/22 improved to 10 times Goal status: IN PROGRESS  2.  Pt will improve bilateral hip MMT by > 1/2 grade in order to improve pain tolerance during community mobility activities. .  Baseline: see MMT Goal status: IN PROGRESS  3.  Pt will improve Modified Oswestry score by > 5 points in order to demonstrate improved pain with functional goals and outcomes. Baseline: see objective Goal status: IN PROGRESS  4.  Pt will report 2/10 pain with mobility in order to demonstrate reduced pain with overall functional status.  Baseline: see objective Goal status: NOT MET  PLAN:  PT FREQUENCY: 2x/week  PT DURATION: 4 weeks  PLANNED INTERVENTIONS: 97164- PT Re-evaluation, 97110-Therapeutic exercises, 97530- Therapeutic  activity, 97112- Neuromuscular re-education, 97535- Self Care, 91478- Manual therapy, [phone]- Gait training, Balance training, and Stair training.  PLAN FOR NEXT SESSION: core stability, BLE strengthening  Elie Goody, DPT [facility] [place] [phone] office  Nelida Meuse, PT 01/19/2023, 2:31 PM

## 2023-01-22 ENCOUNTER — Ambulatory Visit (HOSPITAL_COMMUNITY): Payer: Medicare PPO

## 2023-01-22 ENCOUNTER — Encounter (HOSPITAL_COMMUNITY): Payer: Self-pay

## 2023-01-22 DIAGNOSIS — M6281 Muscle weakness (generalized): Secondary | ICD-10-CM | POA: Diagnosis not present

## 2023-01-22 DIAGNOSIS — M47816 Spondylosis without myelopathy or radiculopathy, lumbar region: Secondary | ICD-10-CM

## 2023-01-22 NOTE — Therapy (Signed)
OUTPATIENT PHYSICAL THERAPY THORACOLUMBAR TREATMENT   Patient Name: Casey Armstrong MRN: [id number] DOB:Sep 17, 1945, 77 y.o., female Today's Date: 01/22/2023  END OF SESSION:  PT End of Session - 01/22/23 1225     Visit Number 19    Number of Visits 20    Date for PT Re-Evaluation 01/23/23    Authorization Type [employer]    Authorization - Visit Number 7    Authorization - Number of Visits 8    Progress Note Due on Visit 8    PT Start Time 1142    PT Stop Time 1223    PT Time Calculation (min) 41 min    Activity Tolerance Patient tolerated treatment well    Behavior During Therapy WFL for tasks assessed/performed                 Past Medical History:  Diagnosis Date   Arthritis    Headache    Lichen sclerosus    Neuromuscular disorder (HCC)    PONV (postoperative nausea and vomiting)    Past Surgical History:  Procedure Laterality Date   CATARACT EXTRACTION W/PHACO  08/15/2010   Procedure: CATARACT EXTRACTION PHACO AND INTRAOCULAR LENS PLACEMENT (IOC);  Surgeon: Gemma Payor;  Location: [facility];  Service: Ophthalmology;  Laterality: Left;  CDE 14.67   CATARACT EXTRACTION W/PHACO  08/25/2010   Procedure: CATARACT EXTRACTION PHACO AND INTRAOCULAR LENS PLACEMENT (IOC);  Surgeon: Gemma Payor;  Location: [facility];  Service: Ophthalmology;  Laterality: Right;  CDE 12.65   COLONOSCOPY  01/30/2011   Procedure: COLONOSCOPY;  Surgeon: Corbin Ade, MD;  Location: [facility];  Service: Endoscopy;  Laterality: N/A;  8:30 AM   DILATION AND CURETTAGE OF UTERUS     30+ yrs ago   EYE SURGERY     left KPE w/ IOL   TONSILLECTOMY     TUBAL LIGATION     Patient Active Problem List   Diagnosis Date Noted   Spondylolisthesis of lumbar region 05/03/2022   Superficial fungus infection of skin 03/03/2020   Vulvar itching 06/05/2016   Lichen sclerosus et atrophicus of the vulva 02/03/2016    PCP: Carylon Perches MD  REFERRING PROVIDER: Floreen Comber, NP   REFERRING DIAG: [id number]  (ICD-10-CM) - Spondylosis without myelopathy or radiculopathy, lumbar region   Rationale for Evaluation and Treatment: Rehabilitation  THERAPY DIAG:  Muscle weakness (generalized)  Spondylosis of lumbar region without myelopathy or radiculopathy  ONSET DATE: 05/03/2022  Next apt date: 12/14/22  SUBJECTIVE:  SUBJECTIVE STATEMENT: Pt reports 1-2/10 today. Pt reports being sore on Friday after last session but cleared up over the weekend.  PERTINENT HISTORY:  Posterior Fusion   PAIN:  Are you having pain? Yes: NPRS scale: 0/10 Pain location: Bilateral SIJ and spans out laterally Pain description: Achy, sharp, tingles and just "hurts" its just annoying Aggravating factors: long term functional activities Relieving factors: "nothing"  PRECAUTIONS: None  RED FLAGS: None   WEIGHT BEARING RESTRICTIONS: No  FALLS:  Has patient fallen in last 6 months? No  LIVING ENVIRONMENT: Lives with: lives alone Lives in: House/apartment Stairs: Yes: Internal: 1 flight steps; on left going up and External: 2 steps; on left going up Has following equipment at home: Walker - 2 wheeled and bed side commode  OCCUPATION: Retired; cares for grandchild  PLOF: Independent  PATIENT GOALS: "bend over without hurting"  NEXT MD VISIT: Next Month  OBJECTIVE:  Note: Objective measures were completed at Evaluation unless otherwise noted.  DIAGNOSTIC FINDINGS:    PATIENT SURVEYS:  Modified Oswestry 24/50  12/13/22: Oswestry 22/50   COGNITION: Overall cognitive status: Within functional limits for tasks assessed     SENSATION: WFL   POSTURE:   PALPATION: Increased tension through lumbar paraspinals  LUMBAR ROM:   AROM eval  Flexion 70%  Extension 10%  Right lateral flexion 20%  Left lateral  flexion 20%  Right rotation   Left rotation    (Blank rows = not tested)  LOWER EXTREMITY ROM:     Active  Right eval Left eval  Hip flexion    Hip extension    Hip abduction    Hip adduction    Hip internal rotation    Hip external rotation    Knee flexion    Knee extension    Ankle dorsiflexion    Ankle plantarflexion    Ankle inversion    Ankle eversion     (Blank rows = not tested)  LOWER EXTREMITY MMT:    MMT Right eval Left eval Right 12/13/22 Left 12/13/22  Hip flexion 3- 3+ 4/5 4/5  Hip extension 3 3 3+ 3/5  Hip abduction 3+ 3+ 4- 4/5  Hip adduction      Hip internal rotation      Hip external rotation      Knee flexion   4 4  Knee extension 4- 4- 4 4  Ankle dorsiflexion      Ankle plantarflexion      Ankle inversion      Ankle eversion       (Blank rows = not tested)    FUNCTIONAL TESTS:  30 seconds chair stand test:9x 2 minute walk test: 349ft  12/13/22: 435ft 30 seconds chair stand test:10x    GAIT: Distance walked: 310 feet Assistive device utilized: None Level of assistance: Complete Independence Comments: Left lean in trunk with bilateral trendelenberg, R shoulder elevated during ambulation.    TODAY'S TREATMENT:  DATE:  01/22/2023  -5' Nustep -Activity practice in ADL bathroom bending and lowerself down to wash hair in bathtub-educated on use of grab bars- modifications if necessary for handheld shower heads, 4in box setup and cues for lateral approach for washing hair in side tub.  -Fall recovery with and without use BUE support on structure. Pt demonstrated independent recovery from quadruped to standing through BUE on floor to knees into standing. Educated on various ways to use surfaces and techniques to approach fall and recovery -Education importance of activity tolerance, modification, maintenance of  activity levels to promote healthy aging and reducing risk of injury    01/19/2023  -Tandem stance 3x 1' with tidal tank in elbow flexion hold bilaterally -70ft x 3 with tidal tank carrying in elbow flexion hold-core stabilization with water perturbations -8x sit/stand transfers with tidal tank- supervision level   01/10/23: -5' Nustep lvl 3 seat 7 -44ft farmer's carry with unilateral w/ 10lb dumbbell x2 -Proper lifting 10# in 8#  box total of 18# 2sets 5 reps -SLS Lt 5" Rt  2" max -Vector 5x 5" with 1 finger A Tandem on foam 2x 30" intermittent HHA -Retrogait with bodycraft 20# 8x -x8 sumo deadlift with 15lb   PATIENT EDUCATION:  Education details: PT Evaluation, findings, prognosis, frequency, attendance policy, and HEP if given.  Person educated: Patient Education method: Medical illustrator Education comprehension: verbalized understanding  HOME EXERCISE PROGRAM: Access Code: [email or web address] URL: [email or web address] Date: 11/16/2022 Prepared by: Starling Manns  Exercises - Supine Lower Trunk Rotation  - 1 x daily - 7 x weekly - 3 sets - 10 reps - Clamshell  - 1 x daily - 7 x weekly - 3 sets - 10 reps - Supine Bridge  - 1 x daily - 7 x weekly - 3 sets - 10 reps  - Single Leg Stance  - 1-2 x daily - 7 x weekly - 1 sets - 5 reps - 30" hold - Standing Tandem Balance with Counter Support  - 1 x daily - 7 x weekly - 1 sets - 3 reps - 30" hold  Access Code: [email or web address] URL: [email or web address] Date: 01/04/2023 Prepared by: Starling Manns  Exercises - Standing Hip Abduction with Resistance at Ankles and Counter Support  - 1 x daily - 7 x weekly - 3 sets - 10 reps ASSESSMENT:  CLINICAL IMPRESSION: Session addressed various concerns for returning to full mobility of ADLs and fall recovery. Pt tolerated education well with good carryover for tasks initiated. Cues and modifications for activities were given with good feedback from pt. Expect DC  at next session.  Pt will benefit from skilled Physical Therapy services to address deficits/limitations in order to improve functional and QOL.     OBJECTIVE IMPAIRMENTS: Abnormal gait, decreased balance, decreased coordination, decreased mobility, decreased ROM, decreased strength, impaired flexibility, and pain.   ACTIVITY LIMITATIONS: carrying, lifting, bending, sitting, standing, squatting, sleeping, transfers, and locomotion level  PARTICIPATION LIMITATIONS: cleaning, laundry, shopping, community activity, occupation, and yard work  PERSONAL FACTORS: Age are also affecting patient's functional outcome.   REHAB POTENTIAL: Good  CLINICAL DECISION MAKING: Stable/uncomplicated  EVALUATION COMPLEXITY: Low   GOALS: Goals reviewed with patient? No  SHORT TERM GOALS: Target date: 12/14/18  Pt will be independent with HEP in order to demonstrate participation in Physical Therapy POC.  Baseline: 12/13/22:  Reports compliance with HEP daily Goal status: MET  2.  Pt will report 3/10 pain with mobility in order to demonstrate improved pain with functional activities.  Baseline: 7/10 worst; 12/13/22 usually 4/10 has increased to 7/10 when taking bath while having to hold leg in air Goal status: IN PROGRESS  LONG TERM GOALS: Target date: 01/11/2023  Pt will improve 30 second chair test by MCID in order to demonstrate improved functional strength to return to desired activities.  Baseline:  Eval 9 times;  12/13/22 improved to 10 times Goal status: IN PROGRESS  2.  Pt will improve bilateral hip MMT by > 1/2 grade in order to improve pain tolerance during community mobility activities. .  Baseline: see MMT Goal status: IN PROGRESS  3.  Pt will improve Modified Oswestry score by > 5 points in order to demonstrate improved pain with functional goals and outcomes. Baseline: see objective Goal status: IN PROGRESS  4.  Pt will report 2/10 pain with mobility in order to demonstrate reduced  pain with overall functional status.  Baseline: see objective Goal status: NOT MET  PLAN:  PT FREQUENCY: 2x/week  PT DURATION: 4 weeks  PLANNED INTERVENTIONS: 97164- PT Re-evaluation, 97110-Therapeutic exercises, 97530- Therapeutic activity, 97112- Neuromuscular re-education, 97535- Self Care, 16109- Manual therapy, [phone]- Gait training, Balance training, and Stair training.  PLAN FOR NEXT SESSION: core stability, BLE strengthening  Elie Goody, DPT [facility] [place] [phone] office  Nelida Meuse, PT 01/22/2023, 12:30 PM

## 2023-01-23 ENCOUNTER — Ambulatory Visit (HOSPITAL_COMMUNITY): Payer: Medicare PPO

## 2023-01-23 ENCOUNTER — Encounter (HOSPITAL_COMMUNITY): Payer: Self-pay

## 2023-01-23 DIAGNOSIS — M47816 Spondylosis without myelopathy or radiculopathy, lumbar region: Secondary | ICD-10-CM

## 2023-01-23 DIAGNOSIS — M6281 Muscle weakness (generalized): Secondary | ICD-10-CM | POA: Diagnosis not present

## 2023-01-23 NOTE — Therapy (Signed)
 OUTPATIENT PHYSICAL THERAPY THORACOLUMBAR TREATMENT PHYSICAL THERAPY DISCHARGE SUMMARY  Visits from Start of Care: 20  Current functional level related to goals / functional outcomes: See below   Remaining deficits: See below   Education / Equipment: NA   Patient agrees to discharge. Patient goals were met. Patient is being discharged due to meeting the stated rehab goals.   Patient Name: Casey Armstrong MRN: [id number] DOB:06/09/1945, 77 y.o., female Today's Date: 01/23/2023  END OF SESSION:  PT End of Session - 01/23/23 1420     Visit Number 20    Number of Visits 20    Date for PT Re-Evaluation 01/23/23    Authorization Type [employer]    Authorization Time Period 8v from 12/21/22-01/23/23    Authorization - Visit Number 8    Authorization - Number of Visits 8    Progress Note Due on Visit 8    PT Start Time 1347    PT Stop Time 1415    PT Time Calculation (min) 28 min    Activity Tolerance Patient tolerated treatment well    Behavior During Therapy [facility] for tasks assessed/performed                  Past Medical History:  Diagnosis Date   Arthritis    Headache    Lichen sclerosus    Neuromuscular disorder (HCC)    PONV (postoperative nausea and vomiting)    Past Surgical History:  Procedure Laterality Date   CATARACT EXTRACTION W/PHACO  08/15/2010   Procedure: CATARACT EXTRACTION PHACO AND INTRAOCULAR LENS PLACEMENT (IOC);  Surgeon: Cherene Mania;  Location: [facility];  Service: Ophthalmology;  Laterality: Left;  CDE 14.67   CATARACT EXTRACTION W/PHACO  08/25/2010   Procedure: CATARACT EXTRACTION PHACO AND INTRAOCULAR LENS PLACEMENT (IOC);  Surgeon: Cherene Mania;  Location: [facility];  Service: Ophthalmology;  Laterality: Right;  CDE 12.65   COLONOSCOPY  01/30/2011   Procedure: COLONOSCOPY;  Surgeon: Lamar CHRISTELLA Hollingshead, MD;  Location: [facility];  Service: Endoscopy;  Laterality: N/A;  8:30 AM   DILATION AND CURETTAGE OF UTERUS     30+ yrs ago   EYE SURGERY      left KPE w/ IOL   TONSILLECTOMY     TUBAL LIGATION     Patient Active Problem List   Diagnosis Date Noted   Spondylolisthesis of lumbar region 05/03/2022   Superficial fungus infection of skin 03/03/2020   Vulvar itching 06/05/2016   Lichen sclerosus et atrophicus of the vulva 02/03/2016    PCP: Sheryle Carwin MD  REFERRING PROVIDER: Jennetta Gerard BIRCH, NP   REFERRING DIAG: [phone] (ICD-10-CM) - Spondylosis without myelopathy or radiculopathy, lumbar region   Rationale for Evaluation and Treatment: Rehabilitation  THERAPY DIAG:  Muscle weakness (generalized)  Spondylosis of lumbar region without myelopathy or radiculopathy  ONSET DATE: 05/03/2022  Next apt date: 12/14/22  SUBJECTIVE:  SUBJECTIVE STATEMENT: Pt reports 1-2/10 today. Pt reports being sore on Friday after last session but cleared up over the weekend.  PERTINENT HISTORY:  Posterior Fusion   PAIN:  Are you having pain? Yes: NPRS scale: 0/10 Pain location: Bilateral SIJ and spans out laterally Pain description: Achy, sharp, tingles and just hurts its just annoying Aggravating factors: long term functional activities Relieving factors: nothing  PRECAUTIONS: None  RED FLAGS: None   WEIGHT BEARING RESTRICTIONS: No  FALLS:  Has patient fallen in last 6 months? No  LIVING ENVIRONMENT: Lives with: lives alone Lives in: House/apartment Stairs: Yes: Internal: 1 flight steps; on left going up and External: 2 steps; on left going up Has following equipment at home: Walker - 2 wheeled and bed side commode  OCCUPATION: Retired; cares for grandchild  PLOF: Independent  PATIENT GOALS: bend over without hurting  NEXT MD VISIT: Next Month  OBJECTIVE:  Note: Objective measures were completed at Evaluation unless otherwise  noted.  DIAGNOSTIC FINDINGS:    PATIENT SURVEYS:  Modified Oswestry 24/50  12/13/22: Oswestry 22/50 01/23/2023:10/50 or 20%  COGNITION: Overall cognitive status: Within functional limits for tasks assessed     SENSATION: WFL   POSTURE:   PALPATION: Increased tension through lumbar paraspinals  LUMBAR ROM:   AROM eval 01/23/2023 Re-evaluation  Flexion 70% 90%  Extension 10% 25%  Right lateral flexion 20% 50%  Left lateral flexion 20% 50%  Right rotation    Left rotation     (Blank rows = not tested)  LOWER EXTREMITY ROM:     Active  Right eval Left eval  Hip flexion    Hip extension    Hip abduction    Hip adduction    Hip internal rotation    Hip external rotation    Knee flexion    Knee extension    Ankle dorsiflexion    Ankle plantarflexion    Ankle inversion    Ankle eversion     (Blank rows = not tested)  LOWER EXTREMITY MMT:    MMT Right eval Left eval Right 12/13/22 Left 12/13/22 01/23/2023 Re-evaluation  R 01/23/2023 Re-evaluation L  Hip flexion 3- 3+ 4/5 4/5    Hip extension 3 3 3+ 3/5 4- 4-  Hip abduction 3+ 3+ 4- 4/5 4- 4  Hip adduction        Hip internal rotation        Hip external rotation        Knee flexion   4 4    Knee extension 4- 4- 4 4    Ankle dorsiflexion        Ankle plantarflexion        Ankle inversion        Ankle eversion         (Blank rows = not tested)    FUNCTIONAL TESTS:  30 seconds chair stand test:9x 2 minute walk test: 321ft  12/13/22: 461ft 30 seconds chair stand test:10x  01/23/2023 30 Second Chair Stand test: 11x : 461ft    GAIT: Distance walked: 310 feet Assistive device utilized: None Level of assistance: Complete Independence Comments: Left lean in trunk with bilateral trendelenberg, R shoulder elevated during ambulation.    TODAY'S TREATMENT:  DATE:  01/23/2023  -Modified Oswestry -MMT -Lumbar ROM - -30 Second Chair Test  01/22/2023  -5' Nustep -Activity practice in ADL bathroom bending and lowerself down to wash hair in bathtub-educated on use of grab bars- modifications if necessary for handheld shower heads, 4in box setup and cues for lateral approach for washing hair in side tub.  -Fall recovery with and without use BUE support on structure. Pt demonstrated independent recovery from quadruped to standing through BUE on floor to knees into standing. Educated on various ways to use surfaces and techniques to approach fall and recovery -Education importance of activity tolerance, modification, maintenance of activity levels to promote healthy aging and reducing risk of injury    01/19/2023  -Tandem stance 3x 1' with tidal tank in elbow flexion hold bilaterally -69ft x 3 with tidal tank carrying in elbow flexion hold-core stabilization with water  perturbations -8x sit/stand transfers with tidal tank- supervision level  PATIENT EDUCATION:  Education details: PT Evaluation, findings, prognosis, frequency, attendance policy, and HEP if given.  Person educated: Patient Education method: Medical Illustrator Education comprehension: verbalized understanding  HOME EXERCISE PROGRAM: Access Code: [email or web address] URL: [email or web address] Date: 11/16/2022 Prepared by: Omega Bottcher  Exercises - Supine Lower Trunk Rotation  - 1 x daily - 7 x weekly - 3 sets - 10 reps - Clamshell  - 1 x daily - 7 x weekly - 3 sets - 10 reps - Supine Bridge  - 1 x daily - 7 x weekly - 3 sets - 10 reps  - Single Leg Stance  - 1-2 x daily - 7 x weekly - 1 sets - 5 reps - 30 hold - Standing Tandem Balance with Counter Support  - 1 x daily - 7 x weekly - 1 sets - 3 reps - 30 hold  Access Code: [email or web address] URL: [email or web address] Date: 01/04/2023 Prepared by: Omega Bottcher  Exercises - Standing Hip  Abduction with Resistance at Ankles and Counter Support  - 1 x daily - 7 x weekly - 3 sets - 10 reps ASSESSMENT:  CLINICAL IMPRESSION: Pt showed great progress in all goals with significant improvements in outcome measures and . Pt pleased with progress. Pt is discharged from skilled PT services at this time.  OBJECTIVE IMPAIRMENTS: Abnormal gait, decreased balance, decreased coordination, decreased mobility, decreased ROM, decreased strength, impaired flexibility, and pain.   ACTIVITY LIMITATIONS: carrying, lifting, bending, sitting, standing, squatting, sleeping, transfers, and locomotion level  PARTICIPATION LIMITATIONS: cleaning, laundry, shopping, community activity, occupation, and yard work  PERSONAL FACTORS: Age are also affecting patient's functional outcome.   REHAB POTENTIAL: Good  CLINICAL DECISION MAKING: Stable/uncomplicated  EVALUATION COMPLEXITY: Low   GOALS: Goals reviewed with patient? No  SHORT TERM GOALS: Target date: 12/14/18  Pt will be independent with HEP in order to demonstrate participation in Physical Therapy POC.  Baseline: 12/13/22:  Reports compliance with HEP daily Goal status: MET  2.  Pt will report 3/10 pain with mobility in order to demonstrate improved pain with functional activities.  Baseline: 7/10 worst; 12/13/22 usually 4/10 has increased to 7/10 when taking bath while having to hold leg in air Goal status: MET  LONG TERM GOALS: Target date: 01/11/2023  Pt will improve 30 second chair test by MCID in order to demonstrate improved functional strength to return to desired activities.  Baseline:  Eval 9 times;  12/13/22 improved to 10 times Goal status: MET  2.  Pt will improve bilateral hip MMT by > 1/2 grade in order to improve  pain tolerance during community mobility activities. .  Baseline: see MMT Goal status: MET  3.  Pt will improve Modified Oswestry score by > 5 points in order to demonstrate improved pain with functional  goals and outcomes. Baseline: see objective Goal status: MET  4.  Pt will report 2/10 pain with mobility in order to demonstrate reduced pain with overall functional status.  Baseline: see objective Goal status: MET  PLAN:  PT FREQUENCY: 2x/week  PT DURATION: 4 weeks  PLANNED INTERVENTIONS: 97164- PT Re-evaluation, 97110-Therapeutic exercises, 97530- Therapeutic activity, 97112- Neuromuscular re-education, 97535- Self Care, 02859- Manual therapy, [phone]- Gait training, Balance training, and Stair training.  PLAN FOR NEXT SESSION: core stability, BLE strengthening  Omega JONETTA Donna ALMETA, DPT [facility] [place] [phone] office  Omega JONETTA Donna, PT 01/23/2023, 2:20 PM

## 2023-03-20 DIAGNOSIS — M4316 Spondylolisthesis, lumbar region: Secondary | ICD-10-CM | POA: Diagnosis not present

## 2023-04-02 DIAGNOSIS — L821 Other seborrheic keratosis: Secondary | ICD-10-CM | POA: Diagnosis not present

## 2023-04-02 DIAGNOSIS — L308 Other specified dermatitis: Secondary | ICD-10-CM | POA: Diagnosis not present

## 2023-04-02 DIAGNOSIS — D225 Melanocytic nevi of trunk: Secondary | ICD-10-CM | POA: Diagnosis not present

## 2023-04-02 DIAGNOSIS — Z1283 Encounter for screening for malignant neoplasm of skin: Secondary | ICD-10-CM | POA: Diagnosis not present

## 2023-04-09 ENCOUNTER — Other Ambulatory Visit (HOSPITAL_COMMUNITY): Payer: Self-pay | Admitting: Internal Medicine

## 2023-04-09 DIAGNOSIS — Z780 Asymptomatic menopausal state: Secondary | ICD-10-CM

## 2023-04-11 ENCOUNTER — Ambulatory Visit (HOSPITAL_COMMUNITY)
Admission: RE | Admit: 2023-04-11 | Discharge: 2023-04-11 | Disposition: A | Discharge status: Home / Self Care | Source: Ambulatory Visit | Attending: Internal Medicine | Admitting: Internal Medicine

## 2023-04-11 DIAGNOSIS — Z780 Asymptomatic menopausal state: Secondary | ICD-10-CM | POA: Insufficient documentation

## 2023-04-11 DIAGNOSIS — M85832 Other specified disorders of bone density and structure, left forearm: Secondary | ICD-10-CM | POA: Diagnosis not present

## 2023-04-24 DIAGNOSIS — R7303 Prediabetes: Secondary | ICD-10-CM | POA: Diagnosis not present

## 2023-05-01 DIAGNOSIS — I700 Atherosclerosis of aorta: Secondary | ICD-10-CM | POA: Diagnosis not present

## 2023-05-01 DIAGNOSIS — B009 Herpesviral infection, unspecified: Secondary | ICD-10-CM | POA: Diagnosis not present

## 2023-05-01 DIAGNOSIS — R739 Hyperglycemia, unspecified: Secondary | ICD-10-CM | POA: Diagnosis not present

## 2023-05-01 DIAGNOSIS — R7303 Prediabetes: Secondary | ICD-10-CM | POA: Diagnosis not present

## 2023-06-15 DIAGNOSIS — E785 Hyperlipidemia, unspecified: Secondary | ICD-10-CM | POA: Diagnosis not present

## 2023-06-15 DIAGNOSIS — I700 Atherosclerosis of aorta: Secondary | ICD-10-CM | POA: Diagnosis not present

## 2023-06-15 DIAGNOSIS — Z79899 Other long term (current) drug therapy: Secondary | ICD-10-CM | POA: Diagnosis not present

## 2023-06-25 DIAGNOSIS — L200 Besnier's prurigo: Secondary | ICD-10-CM | POA: Diagnosis not present

## 2023-06-25 DIAGNOSIS — E785 Hyperlipidemia, unspecified: Secondary | ICD-10-CM | POA: Diagnosis not present

## 2023-06-25 DIAGNOSIS — M170 Bilateral primary osteoarthritis of knee: Secondary | ICD-10-CM | POA: Diagnosis not present

## 2023-06-25 DIAGNOSIS — M6530 Trigger finger, unspecified finger: Secondary | ICD-10-CM | POA: Diagnosis not present

## 2023-07-25 ENCOUNTER — Other Ambulatory Visit: Payer: Self-pay

## 2023-07-25 ENCOUNTER — Ambulatory Visit: Admitting: Orthopaedic Surgery

## 2023-07-25 ENCOUNTER — Other Ambulatory Visit (HOSPITAL_COMMUNITY): Payer: Self-pay | Admitting: Internal Medicine

## 2023-07-25 ENCOUNTER — Encounter: Payer: Self-pay | Admitting: Orthopaedic Surgery

## 2023-07-25 ENCOUNTER — Other Ambulatory Visit (INDEPENDENT_AMBULATORY_CARE_PROVIDER_SITE_OTHER)

## 2023-07-25 VITALS — BP 139/80 | HR 81 | Ht 63.0 in | Wt 136.0 lb

## 2023-07-25 DIAGNOSIS — Z1231 Encounter for screening mammogram for malignant neoplasm of breast: Secondary | ICD-10-CM

## 2023-07-25 DIAGNOSIS — M25561 Pain in right knee: Secondary | ICD-10-CM

## 2023-07-25 DIAGNOSIS — G8929 Other chronic pain: Secondary | ICD-10-CM

## 2023-07-25 DIAGNOSIS — M25562 Pain in left knee: Secondary | ICD-10-CM | POA: Diagnosis not present

## 2023-07-25 DIAGNOSIS — M65342 Trigger finger, left ring finger: Secondary | ICD-10-CM | POA: Diagnosis not present

## 2023-07-25 MED ORDER — METHYLPREDNISOLONE ACETATE 40 MG/ML IJ SUSP
40.0000 mg | Freq: Once | INTRAMUSCULAR | Status: AC
  Administered 2023-07-25: 40 mg via INTRA_ARTICULAR

## 2023-07-25 NOTE — Progress Notes (Signed)
 Subjective:    Patient ID: Casey Armstrong, female    DOB: [birth year], 78 y.o.   MRN: [id number]  HPI She has two problems.  First, she has triggering of the left ring finger.  It has been present about six months and slowly getting worse.  She has no trauma, no redness, no numbness.  Secondly, she has pain in both knees, more on the right.  She has swelling and popping but no giving way, no trauma, no redness, no numbness.  She takes Tylenol  and it helps a little.  She had back surgery last year and she says she has just fallen apart since then.   Review of Systems  Constitutional:  Positive for activity change.  Musculoskeletal:  Positive for arthralgias, gait problem, joint swelling and myalgias.  All other systems reviewed and are negative. For Review of Systems, all other systems reviewed and are negative.  The following is a summary of the past history medically, past history surgically, known current medicines, social history and family history.  This information is gathered electronically by the computer from prior information and documentation.  I review this each visit and have found including this information at this point in the chart is beneficial and informative.   Past Medical History:  Diagnosis Date   Arthritis    Headache    Lichen sclerosus    Neuromuscular disorder (HCC)    PONV (postoperative nausea and vomiting)     Past Surgical History:  Procedure Laterality Date   CATARACT EXTRACTION W/PHACO  08/15/2010   Procedure: CATARACT EXTRACTION PHACO AND INTRAOCULAR LENS PLACEMENT (IOC);  Surgeon: Cherene Mania;  Location: [facility];  Service: Ophthalmology;  Laterality: Left;  CDE 14.67   CATARACT EXTRACTION W/PHACO  08/25/2010   Procedure: CATARACT EXTRACTION PHACO AND INTRAOCULAR LENS PLACEMENT (IOC);  Surgeon: Cherene Mania;  Location: [facility];  Service: Ophthalmology;  Laterality: Right;  CDE 12.65   COLONOSCOPY  01/30/2011   Procedure: COLONOSCOPY;  Surgeon: Lamar CHRISTELLA Hollingshead, MD;  Location[facility];  Service: Endoscopy;  Laterality: N/A;  8:30 AM   DILATION AND CURETTAGE OF UTERUS     30+ yrs ago   EYE SURGERY     left KPE w/ IOL   TONSILLECTOMY     TUBAL LIGATION      Current Outpatient Medications on File Prior to Visit  Medication Sig Dispense Refill   acetaminophen  (TYLENOL ) 500 MG tablet Take 500-1,000 mg by mouth 3 (three) times daily as needed (pain.).     alendronate (FOSAMAX) 70 MG tablet Take 70 mg by mouth every Sunday. Take with a full glass of water  on an empty stomach.     atorvastatin  (LIPITOR) 40 MG tablet Take 40 mg by mouth every evening. (Patient not taking: Reported on 11/15/2022)     carboxymethylcellul-glycerin (OPTIVE) 0.5-0.9 % ophthalmic solution Place 1 drop into both eyes 4 (four) times daily as needed (dry/irritated eyes.).     Cholecalciferol (VITAMIN D3 PO) Take 1,000 Units by mouth in the morning.     cyclobenzaprine  (FLEXERIL ) 5 MG tablet Take 1 tablet (5 mg total) by mouth 3 (three) times daily as needed for muscle spasms. 30 tablet 0   docusate sodium  (COLACE) 100 MG capsule Take 1 capsule (100 mg total) by mouth 2 (two) times daily. 30 capsule 0   MAGNESIUM PO Take 500 mg by mouth at bedtime.     oxyCODONE -acetaminophen  (PERCOCET/ROXICET) 5-325 MG tablet Take 1-2 tablets by mouth every 4 (four)  hours as needed for moderate pain. 30 tablet 0   zinc  sulfate 220 (50 Zn) MG capsule Take 220 mg by mouth in the morning.     No current facility-administered medications on file prior to visit.    Social History   Socioeconomic History   Marital status: Divorced    Spouse name: Not on file   Number of children: Not on file   Years of education: Not on file   Highest education level: Not on file  Occupational History   Not on file  Tobacco Use   Smoking status: Former    Current packs/day: 0.00    Types: Cigarettes    Quit date: 08/10/1973    Years since quitting: 49.9   Smokeless tobacco: Never  Vaping Use    Vaping status: Never Used  Substance and Sexual Activity   Alcohol use: Yes    Alcohol/week: 1.0 standard drink of alcohol    Types: 1 Glasses of wine per week    Comment: once in a blue moon   Drug use: No   Sexual activity: Not Currently    Birth control/protection: Post-menopausal, Surgical    Comment: tubal  Other Topics Concern   Not on file  Social History Narrative   Not on file   Social Drivers of Health   Financial Resource Strain: Not on file  Food Insecurity: Not on file  Transportation Needs: Not on file  Physical Activity: Not on file  Stress: Not on file  Social Connections: Not on file  Intimate Partner Violence: Not on file    Family History  Problem Relation Age of Onset   Alzheimer's disease Mother    Cancer Father    Suicidality Brother    Anesthesia problems Neg Hx    Hypotension Neg Hx    Malignant hyperthermia Neg Hx    Pseudochol deficiency Neg Hx     BP 139/80   Pulse 81   Ht 5' 3 (1.6 m)   Wt 136 lb (61.7 kg)   BMI 24.09 kg/m   Body mass index is 24.09 kg/m.      Objective:   Physical Exam Vitals and nursing note reviewed. Exam conducted with a chaperone present.  Constitutional:      Appearance: She is well-developed.  HENT:     Head: Normocephalic and atraumatic.  Eyes:     Conjunctiva/sclera: Conjunctivae normal.     Pupils: Pupils are equal, round, and reactive to light.  Cardiovascular:     Rate and Rhythm: Normal rate and regular rhythm.  Pulmonary:     Effort: Pulmonary effort is normal.  Abdominal:     Palpations: Abdomen is soft.  Musculoskeletal:       Hands:     Cervical back: Normal range of motion and neck supple.       Legs:  Skin:    General: Skin is warm and dry.  Neurological:     Mental Status: She is alert and oriented to person, place, and time.     Cranial Nerves: No cranial nerve deficit.     Motor: No abnormal muscle tone.     Coordination: Coordination normal.     Deep Tendon Reflexes:  Reflexes are normal and symmetric. Reflexes normal.  Psychiatric:        Behavior: Behavior normal.        Thought Content: Thought content normal.        Judgment: Judgment normal.    X-rays were done of the knees,  reported separately.       Assessment & Plan:   Encounter Diagnoses  Name Primary?   Chronic pain of both knees Yes   Trigger finger, left ring finger    PROCEDURE  Trigger Finger Injection  The left Ring finger  has been locking at the A1 pulley.  The patient has been told about injection of the digit.  Surgical correction and excision of the A1 pulley will resolve the problem.  Ani injection in the digit should help but the results may be short lived.  The patient asked appropriate questions and understands the procedure.  The patient has elected for an injection at this time.  Verbal consent was obtained.  A timeout was taken to confirm the proper hand and digit.  Medication  1 mL of DepoMedrol 40 mg  2 mL of 1% lidocaine  plain  Ethyl chloride for anesthesia  Alcohol was used to prepare the skin along with ethyl chloride and then the injection was made at the A1 pulley there were no complications.  It was tolerated well.  A Band-aid dressing was applied.  Call if any problem or difficulty.   PROCEDURE NOTE:  The patient requests injections of the right knee , verbal consent was obtained.  The right knee was prepped appropriately after time out was performed.   Sterile technique was observed and injection of 1 cc of DepoMedrol 40mg  with several cc's of plain xylocaine . Anesthesia was provided by ethyl chloride and a 20-gauge needle was used to inject the knee area. The injection was tolerated well.  A band aid dressing was applied.  The patient was advised to apply ice later today and tomorrow to the injection sight as needed.  Return in three weeks.  She may need MRI of the knees.  I told her surgery is the definitive treatment for the trigger  finger.  Call if any problem.  Precautions discussed.  Electronically Signed Lemond Stable, MD 7/2/20253:19 PM

## 2023-07-25 NOTE — Addendum Note (Signed)
 Addended by: MARCINE HUSBAND T on: 07/25/2023 03:32 PM   Modules accepted: Orders

## 2023-07-25 NOTE — Progress Notes (Signed)
D

## 2023-08-15 ENCOUNTER — Encounter: Payer: Self-pay | Admitting: Orthopaedic Surgery

## 2023-08-15 ENCOUNTER — Ambulatory Visit: Admitting: Orthopaedic Surgery

## 2023-08-15 DIAGNOSIS — M25561 Pain in right knee: Secondary | ICD-10-CM

## 2023-08-15 DIAGNOSIS — G8929 Other chronic pain: Secondary | ICD-10-CM

## 2023-08-15 DIAGNOSIS — M65342 Trigger finger, left ring finger: Secondary | ICD-10-CM | POA: Diagnosis not present

## 2023-08-15 DIAGNOSIS — M25562 Pain in left knee: Secondary | ICD-10-CM | POA: Diagnosis not present

## 2023-08-15 MED ORDER — METHYLPREDNISOLONE ACETATE 40 MG/ML IJ SUSP
40.0000 mg | Freq: Once | INTRAMUSCULAR | Status: AC
  Administered 2023-08-15: 40 mg via INTRA_ARTICULAR

## 2023-08-15 NOTE — Progress Notes (Signed)
 My finger is better.  My knee still hurts some.  The trigger finger left hand ring finger is much improved.  She has no triggering now.  The left knee is tender, crepitus and swelling but no giving way.  She has no new trauma.  ROM of left knee is 0 to 110, medially tender, stable, slight effusion,crepitus, NV intact.  Encounter Diagnoses  Name Primary?   Chronic pain of both knees Yes   Trigger finger, left ring finger    Return in one month.  PROCEDURE NOTE:  The patient requests injections of the left knee , verbal consent was obtained.  The left knee was prepped appropriately after time out was performed.   Sterile technique was observed and injection of 1 cc of DepoMedrol 40 mg with several cc's of plain xylocaine . Anesthesia was provided by ethyl chloride and a 20-gauge needle was used to inject the knee area. The injection was tolerated well.  A band aid dressing was applied.  The patient was advised to apply ice later today and tomorrow to the injection sight as needed.  Call if any problem.  Precautions discussed.  Electronically Signed Lemond Stable, MD 7/23/20252:34 PM

## 2023-08-15 NOTE — Addendum Note (Signed)
 Addended by: MARCINE HUSBAND T on: 08/15/2023 03:25 PM   Modules accepted: Orders

## 2023-08-29 ENCOUNTER — Encounter (HOSPITAL_COMMUNITY): Payer: Self-pay

## 2023-08-29 ENCOUNTER — Ambulatory Visit (HOSPITAL_COMMUNITY)
Admission: RE | Admit: 2023-08-29 | Discharge: 2023-08-29 | Disposition: A | Discharge status: Home / Self Care | Source: Ambulatory Visit | Attending: Internal Medicine | Admitting: Internal Medicine

## 2023-08-29 DIAGNOSIS — Z1231 Encounter for screening mammogram for malignant neoplasm of breast: Secondary | ICD-10-CM | POA: Insufficient documentation

## 2023-09-12 ENCOUNTER — Encounter: Payer: Self-pay | Admitting: Orthopaedic Surgery

## 2023-09-12 ENCOUNTER — Ambulatory Visit: Admitting: Orthopaedic Surgery

## 2023-09-12 VITALS — BP 140/75 | HR 86 | Ht 63.0 in | Wt 134.0 lb

## 2023-09-12 DIAGNOSIS — G8929 Other chronic pain: Secondary | ICD-10-CM

## 2023-09-12 DIAGNOSIS — M25561 Pain in right knee: Secondary | ICD-10-CM | POA: Diagnosis not present

## 2023-09-12 DIAGNOSIS — M25562 Pain in left knee: Secondary | ICD-10-CM

## 2023-09-12 MED ORDER — METHYLPREDNISOLONE ACETATE 40 MG/ML IJ SUSP
40.0000 mg | Freq: Once | INTRAMUSCULAR | Status: AC
  Administered 2023-09-12: 40 mg via INTRA_ARTICULAR

## 2023-09-12 NOTE — Addendum Note (Signed)
 Addended by: MARCINE HUSBAND T on: 09/12/2023 04:24 PM   Modules accepted: Orders

## 2023-09-12 NOTE — Progress Notes (Signed)
 PROCEDURE NOTE:  The patient requests injections of the right knee , verbal consent was obtained.  The right knee was prepped appropriately after time out was performed.   Sterile technique was observed and injection of 1 cc of DepoMedrol 40mg  with several cc's of plain xylocaine . Anesthesia was provided by ethyl chloride and a 20-gauge needle was used to inject the knee area. The injection was tolerated well.  A band aid dressing was applied.  The patient was advised to apply ice later today and tomorrow to the injection sight as needed.  PROCEDURE NOTE:  The patient requests injections of the left knee , verbal consent was obtained.  The left knee was prepped appropriately after time out was performed.   Sterile technique was observed and injection of 1 cc of DepoMedrol 40 mg with several cc's of plain xylocaine . Anesthesia was provided by ethyl chloride and a 20-gauge needle was used to inject the knee area. The injection was tolerated well.  A band aid dressing was applied.  The patient was advised to apply ice later today and tomorrow to the injection sight as needed.  Encounter Diagnosis  Name Primary?   Chronic pain of both knees Yes   I have informed the patient I will be retiring from medical practice and from this office on October 25, 2023.  The patient has been offered continuing care with Dr. Margrette or Dr. Onesimo of this office.  The patient may choose another provider and the records will be forwarded after proper signature and notification.  Patient understands and agrees.  Return prn.  Call if any problem.  Precautions discussed.  Electronically Signed Lemond Stable, MD 8/20/20252:37 PM

## 2023-09-14 ENCOUNTER — Encounter: Payer: Self-pay | Admitting: Radiology

## 2023-09-18 DIAGNOSIS — M4316 Spondylolisthesis, lumbar region: Secondary | ICD-10-CM | POA: Diagnosis not present

## 2023-09-30 ENCOUNTER — Ambulatory Visit
Admission: EM | Admit: 2023-09-30 | Discharge: 2023-09-30 | Disposition: A | Discharge status: Home / Self Care | Attending: Nurse Practitioner | Admitting: Nurse Practitioner

## 2023-09-30 DIAGNOSIS — J069 Acute upper respiratory infection, unspecified: Secondary | ICD-10-CM

## 2023-09-30 MED ORDER — BENZONATATE 200 MG PO CAPS: 200.0000 mg | ORAL_CAPSULE | Freq: Three times a day (TID) | ORAL | 0 refills | Status: AC | PRN

## 2023-09-30 MED ORDER — FLUTICASONE PROPIONATE 50 MCG/ACT NA SUSP: 2.0000 | Freq: Every day | NASAL | 0 refills | Status: AC

## 2023-09-30 MED ORDER — PREDNISONE 20 MG PO TABS: 40.0000 mg | ORAL_TABLET | Freq: Every day | ORAL | 0 refills | Status: AC

## 2023-09-30 MED ORDER — MUCINEX DM MAXIMUM STRENGTH 60-1200 MG PO TB12: 1.0000 | ORAL_TABLET | Freq: Two times a day (BID) | ORAL | 0 refills | Status: AC

## 2023-09-30 NOTE — Discharge Instructions (Addendum)

## 2023-09-30 NOTE — ED Triage Notes (Signed)
 Pt reports cough, congestion loss of appetite throat irration sinus drainage x 1 week

## 2023-09-30 NOTE — ED Provider Notes (Signed)
 [facility]    CSN: [id number] Arrival date & time: 09/30/23  1040      History   Chief Complaint No chief complaint on file.   HPI Casey Armstrong is a 78 y.o. female.   Discussed the use of AI scribe software for clinical note transcription with the patient, who gave verbal consent to proceed.   The patient presents with a one-week history of cough, congestion, throat irritation, sneezing, sinus drainage, and loss of appetite. These symptoms have persisted since onset. The patient denies experiencing wheezing, shortness of breath, nausea, vomiting, diarrhea, fevers, or headaches. They report body aches, for which they have been taking Tylenol . The patient's urine is described as clear yellow, suggesting adequate hydration despite the loss of appetite. The patient has not taken any medications for their symptoms other than Tylenol . They have not been around anyone sick prior to the onset of their symptoms. The patient has not tested for flu or COVID-19. The cough and congestion are causing discomfort, and the sinus drainage is contributing to throat irritation.   The following portions of the patient's history were reviewed and updated as appropriate: allergies, current medications, past family history, past medical history, past social history, past surgical history, and problem list.    Past Medical History:  Diagnosis Date   Arthritis    Headache    Lichen sclerosus    Neuromuscular disorder (HCC)    PONV (postoperative nausea and vomiting)     Patient Active Problem List   Diagnosis Date Noted   Spondylolisthesis of lumbar region 05/03/2022   Superficial fungus infection of skin 03/03/2020   Vulvar itching 06/05/2016   Lichen sclerosus et atrophicus of the vulva 02/03/2016    Past Surgical History:  Procedure Laterality Date   CATARACT EXTRACTION W/PHACO  08/15/2010   Procedure: CATARACT EXTRACTION PHACO AND INTRAOCULAR LENS PLACEMENT (IOC);  Surgeon: Cherene Mania;  Location: [facility];  Service: Ophthalmology;  Laterality: Left;  CDE 14.67   CATARACT EXTRACTION W/PHACO  08/25/2010   Procedure: CATARACT EXTRACTION PHACO AND INTRAOCULAR LENS PLACEMENT (IOC);  Surgeon: Cherene Mania;  Location: [facility];  Service: Ophthalmology;  Laterality: Right;  CDE 12.65   COLONOSCOPY  01/30/2011   Procedure: COLONOSCOPY;  Surgeon: Lamar CHRISTELLA Hollingshead, MD;  Location: [facility];  Service: Endoscopy;  Laterality: N/A;  8:30 AM   DILATION AND CURETTAGE OF UTERUS     30+ yrs ago   EYE SURGERY     left KPE w/ IOL   TONSILLECTOMY     TUBAL LIGATION      OB History     Gravida  2   Para  2   Term  2   Preterm      AB      Living  2      SAB      IAB      Ectopic      Multiple      Live Births  2            Home Medications    Prior to Admission medications   Medication Sig Start Date End Date Taking? Authorizing Provider  benzonatate  (TESSALON ) 200 MG capsule Take 1 capsule (200 mg total) by mouth 3 (three) times daily as needed for cough. 09/30/23  Yes Iola Lukes, FNP  Dextromethorphan-guaiFENesin  (MUCINEX  DM MAXIMUM STRENGTH) 60-1200 MG TB12 Take 1 tablet by mouth 2 (two) times daily. 09/30/23  Yes Iola Lukes, FNP  fluticasone  (FLONASE ) 50  MCG/ACT nasal spray Place 2 sprays into both nostrils daily. Shake well before use. Gently blow nose before spraying. Do not blow nose immediately after use. You should not taste the medication or feel it going down your throat; if you do, adjust your technique. 09/30/23  Yes Rulon Abdalla, FNP  predniSONE  (DELTASONE ) 20 MG tablet Take 2 tablets (40 mg total) by mouth daily for 5 days. 09/30/23 10/05/23 Yes Wesly Whisenant, FNP  acetaminophen  (TYLENOL ) 500 MG tablet Take 500-1,000 mg by mouth 3 (three) times daily as needed (pain.).    [provider]  alendronate (FOSAMAX) 70 MG tablet Take 70 mg by mouth every Sunday. Take with a full glass of water  on an empty stomach.    [provider]  atorvastatin  (LIPITOR) 40 MG tablet Take 40 mg by mouth every evening. Patient not taking: Reported on 11/15/2022    [provider]  carboxymethylcellul-glycerin (OPTIVE) 0.5-0.9 % ophthalmic solution Place 1 drop into both eyes 4 (four) times daily as needed (dry/irritated eyes.).    [provider]  Cholecalciferol (VITAMIN D3 PO) Take 1,000 Units by mouth in the morning.    [provider]  cyclobenzaprine  (FLEXERIL ) 5 MG tablet Take 1 tablet (5 mg total) by mouth 3 (three) times daily as needed for muscle spasms. 05/04/22   Mavis Purchase, MD  docusate sodium  (COLACE) 100 MG capsule Take 1 capsule (100 mg total) by mouth 2 (two) times daily. 05/04/22   Mavis Purchase, MD  MAGNESIUM PO Take 500 mg by mouth at bedtime.    [provider]  oxyCODONE -acetaminophen  (PERCOCET/ROXICET) 5-325 MG tablet Take 1-2 tablets by mouth every 4 (four) hours as needed for moderate pain. 05/04/22   Mavis Purchase, MD  zinc  sulfate 220 (50 Zn) MG capsule Take 220 mg by mouth in the morning.    [provider]    Family History Family History  Problem Relation Age of Onset   Alzheimer's disease Mother    Cancer Father    Suicidality Brother    Anesthesia problems Neg Hx    Hypotension Neg Hx    Malignant hyperthermia Neg Hx    Pseudochol deficiency Neg Hx     Social History Social History   Tobacco Use   Smoking status: Former    Current packs/day: 0.00    Types: Cigarettes    Quit date: 08/10/1973    Years since quitting: 50.1   Smokeless tobacco: Never  Vaping Use   Vaping status: Never Used  Substance Use Topics   Alcohol use: Yes    Alcohol/week: 1.0 standard drink of alcohol    Types: 1 Glasses of wine per week    Comment: once in a blue moon   Drug use: No     Allergies   Keflex [cephalexin]   Review of Systems Review of Systems  Constitutional:  Positive for appetite change. Negative for fever.  HENT:  Positive  for congestion, postnasal drip, rhinorrhea, sneezing and sore throat.   Respiratory:  Positive for cough. Negative for shortness of breath and wheezing.   Gastrointestinal:  Positive for diarrhea. Negative for nausea and vomiting.  Musculoskeletal:  Positive for myalgias.  Neurological:  Positive for headaches.  All other systems reviewed and are negative.    Physical Exam Triage Vital Signs ED Triage Vitals  Encounter Vitals Group     BP 09/30/23 1204 138/81     Girls Systolic BP Percentile --      Girls Diastolic BP Percentile --  Boys Systolic BP Percentile --      Boys Diastolic BP Percentile --      Pulse Rate 09/30/23 1204 79     Resp 09/30/23 1204 16     Temp 09/30/23 1204 98.2 F (36.8 C)     Temp Source 09/30/23 1204 Oral     SpO2 09/30/23 1204 95 %     Weight --      Height --      Head Circumference --      Peak Flow --      Pain Score 09/30/23 1207 0     Pain Loc --      Pain Education --      Exclude from Growth Chart --    No data found.  Updated Vital Signs BP 138/81 (BP Location: Right Arm)   Pulse 79   Temp 98.2 F (36.8 C) (Oral)   Resp 16   SpO2 95%   Visual Acuity Right Eye Distance:   Left Eye Distance:   Bilateral Distance:    Right Eye Near:   Left Eye Near:    Bilateral Near:     Physical Exam Vitals reviewed.  Constitutional:      General: She is awake. She is not in acute distress.    Appearance: Normal appearance. She is well-developed. She is not ill-appearing, toxic-appearing or diaphoretic.  HENT:     Head: Normocephalic.     Right Ear: Tympanic membrane, ear canal and external ear normal. No drainage, swelling or tenderness. No middle ear effusion. Tympanic membrane is not erythematous.     Left Ear: Tympanic membrane, ear canal and external ear normal. No drainage, swelling or tenderness.  No middle ear effusion. Tympanic membrane is not erythematous.     Nose: Congestion present. No rhinorrhea.     Mouth/Throat:      Lips: Pink.     Mouth: Mucous membranes are moist.     Pharynx: Oropharynx is clear. Uvula midline. No pharyngeal swelling, oropharyngeal exudate, posterior oropharyngeal erythema or uvula swelling.     Tonsils: No tonsillar exudate or tonsillar abscesses.  Eyes:     General: Vision grossly intact.     Conjunctiva/sclera: Conjunctivae normal.  Cardiovascular:     Rate and Rhythm: Normal rate.     Heart sounds: Normal heart sounds.  Pulmonary:     Effort: Pulmonary effort is normal. No tachypnea or respiratory distress.     Breath sounds: Normal breath sounds and air entry.  Musculoskeletal:        General: Normal range of motion.     Cervical back: Normal range of motion and neck supple.  Lymphadenopathy:     Cervical: No cervical adenopathy.  Skin:    General: Skin is warm and dry.  Neurological:     General: No focal deficit present.     Mental Status: She is alert and oriented to person, place, and time.  Psychiatric:        Behavior: Behavior is cooperative.      UC Treatments / Results  Labs (all labs ordered are listed, but only abnormal results are displayed) Labs Reviewed - No data to display  EKG   Radiology No results found.  Procedures Procedures (including critical care time)  Medications Ordered in UC Medications - No data to display  Initial Impression / Assessment and Plan / UC Course  I have reviewed the triage vital signs and the nursing notes.  Pertinent labs & imaging results that were  available during my care of the patient were reviewed by me and considered in my medical decision making (see chart for details).     This the patient presents with symptoms consistent with a viral upper respiratory infection. Exam is reassuring and no evidence of bacterial infection or acute cardiopulmonary process is noted. Supportive care is recommended. Patient was advised to follow up with primary care if symptoms do not improve within one week or if new  concerns arise. Instructions were given to seek emergency care if symptoms worsen, including shortness of breath, chest pain, persistent high fever, inability to tolerate fluids, or confusion.   Today's evaluation has revealed no signs of a dangerous process. Discussed diagnosis with patient and/or guardian. Patient and/or guardian aware of their diagnosis, possible red flag symptoms to watch out for and need for close follow up. Patient and/or guardian understands verbal and written discharge instructions. Patient and/or guardian comfortable with plan and disposition.  Patient and/or guardian has a clear mental status at this time, good insight into illness (after discussion and teaching) and has clear judgment to make decisions regarding their care  Documentation was completed with the aid of voice recognition software. Transcription may contain typographical errors. Final Clinical Impressions(s) / UC Diagnoses   Final diagnoses:  Viral upper respiratory tract infection     Discharge Instructions      Your symptoms are most likely caused by a respiratory infection, which affects areas like your nose, throat, or lungs. This type of infection is usually caused by a virus. Since your illness is caused by a virus, antibiotics won't help because they only treat infections caused by bacteria.  Take the medications that were prescribed to you as directed. If you have a fever, headache, or body aches, you can also take Tylenol  or ibuprofen to help you feel more comfortable. Be sure to drink plenty of fluids to stay hydrated--aim for enough to keep your urine a pale yellow color. This will also help to thin mucus and make it easier to clear from your body.   Using a cool mist humidifier at home to keep humidity levels above 50% can be helpful. You can also inhale steam for 10 to 15 minutes, 3 to 4 times a day. This can be done by sitting in the bathroom with a hot shower running, or by using  over-the-counter vapor shower tablets to help with nasal congestion. Try to avoid cool or dry air as much as possible. When you sleep, keep your head elevated to help reduce post-nasal drainage. Be sure to get enough rest every night to support your recovery. Don't forget to replace your toothbrush once you start feeling better.   It's normal for a cough to linger for several weeks after a respiratory illness, even after other symptoms have resolved. This happens because the airways remain irritated and take time to fully heal. As long as the cough gradually improves and there are no new concerning symptoms, this is part of the normal recovery process.  If your symptoms get worse or if you develop any new or concerning symptoms, go to the emergency room right away. If you're not feeling better in a few days, follow up with your primary care provider.            ED Prescriptions     Medication Sig Dispense Auth. Provider   fluticasone  (FLONASE ) 50 MCG/ACT nasal spray Place 2 sprays into both nostrils daily. Shake well before use. Gently blow nose before spraying.  Do not blow nose immediately after use. You should not taste the medication or feel it going down your throat; if you do, adjust your technique. 16 g Iola Lukes, FNP   benzonatate  (TESSALON ) 200 MG capsule Take 1 capsule (200 mg total) by mouth 3 (three) times daily as needed for cough. 30 capsule Iola Lukes, FNP   Dextromethorphan-guaiFENesin  (MUCINEX  DM MAXIMUM STRENGTH) 60-1200 MG TB12 Take 1 tablet by mouth 2 (two) times daily. 20 tablet Iola Lukes, FNP   predniSONE  (DELTASONE ) 20 MG tablet Take 2 tablets (40 mg total) by mouth daily for 5 days. 10 tablet Iola Lukes, FNP      PDMP not reviewed this encounter.   Iola Lukes, [place] 09/30/23 1257

## 2023-10-15 DIAGNOSIS — F419 Anxiety disorder, unspecified: Secondary | ICD-10-CM | POA: Diagnosis not present

## 2023-10-15 DIAGNOSIS — Z79899 Other long term (current) drug therapy: Secondary | ICD-10-CM | POA: Diagnosis not present

## 2023-10-15 DIAGNOSIS — R030 Elevated blood-pressure reading, without diagnosis of hypertension: Secondary | ICD-10-CM | POA: Diagnosis not present

## 2023-10-22 DIAGNOSIS — I700 Atherosclerosis of aorta: Secondary | ICD-10-CM | POA: Diagnosis not present

## 2023-10-22 DIAGNOSIS — R7301 Impaired fasting glucose: Secondary | ICD-10-CM | POA: Diagnosis not present

## 2023-10-22 DIAGNOSIS — E785 Hyperlipidemia, unspecified: Secondary | ICD-10-CM | POA: Diagnosis not present

## 2023-10-22 DIAGNOSIS — M4310 Spondylolisthesis, site unspecified: Secondary | ICD-10-CM | POA: Diagnosis not present

## 2023-10-22 DIAGNOSIS — A063 Ameboma of intestine: Secondary | ICD-10-CM | POA: Diagnosis not present

## 2023-10-22 DIAGNOSIS — M8000XA Age-related osteoporosis with current pathological fracture, unspecified site, initial encounter for fracture: Secondary | ICD-10-CM | POA: Diagnosis not present

## 2023-10-22 DIAGNOSIS — Z23 Encounter for immunization: Secondary | ICD-10-CM | POA: Diagnosis not present

## 2023-10-22 DIAGNOSIS — I4510 Unspecified right bundle-branch block: Secondary | ICD-10-CM | POA: Diagnosis not present

## 2023-10-22 DIAGNOSIS — Z0001 Encounter for general adult medical examination with abnormal findings: Secondary | ICD-10-CM | POA: Diagnosis not present

## 2023-10-22 DIAGNOSIS — A062 Amebic nondysenteric colitis: Secondary | ICD-10-CM | POA: Diagnosis not present

## 2023-10-25 ENCOUNTER — Ambulatory Visit: Admitting: Orthopaedic Surgery

## 2023-10-25 ENCOUNTER — Encounter: Payer: Self-pay | Admitting: Orthopaedic Surgery

## 2023-10-25 DIAGNOSIS — M25561 Pain in right knee: Secondary | ICD-10-CM | POA: Diagnosis not present

## 2023-10-25 DIAGNOSIS — M25562 Pain in left knee: Secondary | ICD-10-CM

## 2023-10-25 DIAGNOSIS — G8929 Other chronic pain: Secondary | ICD-10-CM

## 2023-10-25 MED ORDER — METHYLPREDNISOLONE ACETATE 40 MG/ML IJ SUSP
40.0000 mg | Freq: Once | INTRAMUSCULAR | Status: AC
  Administered 2023-10-25: 40 mg via INTRA_ARTICULAR

## 2023-10-25 NOTE — Addendum Note (Signed)
 Addended by: VICENTA EMMIE HERO on: 10/25/2023 01:33 PM   Modules accepted: Orders

## 2023-10-25 NOTE — Progress Notes (Signed)
 PROCEDURE NOTE:  The patient requests injections of the right knee , verbal consent was obtained.  The right knee was prepped appropriately after time out was performed.   Sterile technique was observed and injection of 1 cc of DepoMedrol 40mg  with several cc's of plain xylocaine . Anesthesia was provided by ethyl chloride and a 20-gauge needle was used to inject the knee area. The injection was tolerated well.  A band aid dressing was applied.  The patient was advised to apply ice later today and tomorrow to the injection sight as needed.  PROCEDURE NOTE:  The patient requests injections of the left knee , verbal consent was obtained.  The left knee was prepped appropriately after time out was performed.   Sterile technique was observed and injection of 1 cc of DepoMedrol 40 mg with several cc's of plain xylocaine . Anesthesia was provided by ethyl chloride and a 20-gauge needle was used to inject the knee area. The injection was tolerated well.  A band aid dressing was applied.  The patient was advised to apply ice later today and tomorrow to the injection sight as needed.  Encounter Diagnosis  Name Primary?   Chronic pain of both knees Yes   Return prn.  I have informed the patient I will be retiring from medical practice and from this office on October 25, 2023, today.  The patient has been offered continuing care with Dr. Margrette or Dr. Onesimo of this office.  The patient may choose another provider and the records will be forwarded after proper signature and notification.  Patient understands and agrees.  Call if any problem.  Precautions discussed.  Electronically Signed Lemond Stable, MD 10/2/20251:20 PM

## 2023-11-26 ENCOUNTER — Encounter: Payer: Self-pay | Admitting: Radiology

## 2023-12-03 DIAGNOSIS — Z1283 Encounter for screening for malignant neoplasm of skin: Secondary | ICD-10-CM | POA: Diagnosis not present

## 2023-12-03 DIAGNOSIS — D225 Melanocytic nevi of trunk: Secondary | ICD-10-CM | POA: Diagnosis not present

## 2023-12-04 ENCOUNTER — Ambulatory Visit: Admitting: Podiatry

## 2023-12-04 DIAGNOSIS — R29898 Other symptoms and signs involving the musculoskeletal system: Secondary | ICD-10-CM

## 2023-12-04 DIAGNOSIS — G629 Polyneuropathy, unspecified: Secondary | ICD-10-CM | POA: Diagnosis not present

## 2023-12-05 ENCOUNTER — Encounter: Payer: Self-pay | Admitting: Lab

## 2023-12-05 NOTE — Progress Notes (Signed)
  Subjective:  Patient ID: Casey Armstrong, female    DOB: May 22, 1945,  MRN: [id number]  Chief Complaint  Patient presents with   Foot Pain    Patient is here for bilateral foot pain and ankle swelling has had this issue on and off for years but is now much worse and can barley walk. Gets nerve pain down the right leg and the leg feels weak      Discussed the use of AI scribe software for clinical note transcription with the patient, who gave verbal consent to proceed.  History of Present Illness Casey Armstrong is a 78 year old female with spinal stenosis who presents with worsening leg and foot pain. She is accompanied by her daughter.  She experiences stabbing pain in her knees that radiates down her legs into her feet. The pain is severe and limits her ability to walk. She also has weakness in her right leg, causing instability and requiring her to step first with her left foot. She has difficulty bending over to reach her toes due to decreased flexibility. Her activity level has significantly decreased post-surgery, and she is unable to perform yard work or cleaning. Her gait has deteriorated she reports.       Objective:  There were no vitals filed for this visit.  Physical Exam General: AAO x3, NAD  Dermatological: Skin is warm, dry and supple bilateral. There are no open sores, no preulcerative lesions, no rash or signs of infection present.  Vascular: Dorsalis Pedis artery and Posterior Tibial artery pedal pulses are 2/4 bilateral with immedate capillary fill time. There is no pain with calf compression, swelling, warmth, erythema.   Neruologic: On the right foot there is decreased sensation with Semmes Weinstein monofilament to the dorsal aspect of the foot.  Musculoskeletal: I am not able to appreciate any area pinpoint tenderness or tenderness to bilateral feet or lower extremities today.  Ankle, subtalar joint range of motion intact.  Left foot present.  Digital contractures  present.     No images are attached to the encounter.    Results     Assessment:   1. Neuropathy   2. Weakness of right lower extremity      Plan:  Patient was evaluated and treated and all questions answered.  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 Differential includes nerve impingement or neuropathy,  Right leg weakness affects gait and flexibility. - Ordered nerve conduction test to assess nerve function from back to feet. - Will consider further workup with a nerve conduction test. - Encouraged supportive footwear and inserts. - Advised on maintaining mobility and exercise as tolerated.   Return for nerve conduction test results.   Donnice JONELLE Fees DPM

## 2023-12-11 ENCOUNTER — Telehealth: Payer: Self-pay | Admitting: Lab

## 2023-12-11 NOTE — Telephone Encounter (Signed)
 Patient calling for test that was to be done at [facility] I don't see an order please advise.

## 2024-01-21 ENCOUNTER — Telehealth: Payer: Self-pay | Admitting: Lab

## 2024-01-21 NOTE — Telephone Encounter (Signed)
 Patient states had nerve conduction study done and is in severe pain would like to discuss with provider on what is next is in need of pain control.

## 2024-01-21 NOTE — Telephone Encounter (Signed)
 Attempted to call patients daughter. She was not able to talk at time. She is OK if I call her back tomorrow.

## 2024-01-22 ENCOUNTER — Other Ambulatory Visit: Payer: Self-pay | Admitting: Podiatry

## 2024-01-22 ENCOUNTER — Telehealth: Payer: Self-pay | Admitting: Lab

## 2024-01-22 DIAGNOSIS — G629 Polyneuropathy, unspecified: Secondary | ICD-10-CM

## 2024-01-22 NOTE — Telephone Encounter (Signed)
 Patient called upset wanting to speak to provider in regards to her situation I did explain he will get to messages after he is done seeing patients and sure when he is available he will contact her.  She wants to be called at [phone].

## 2024-01-29 ENCOUNTER — Telehealth: Payer: Self-pay

## 2024-01-29 NOTE — Telephone Encounter (Signed)
 Patient called and left a message - she has had her NCV done -she is asking about results and what the next steps are. thanks

## 2024-01-30 ENCOUNTER — Encounter: Payer: Self-pay | Admitting: Lab

## 2024-01-30 NOTE — Telephone Encounter (Signed)
 Neurology referral sent to [facility] [phone] phone [phone] fax.

## 2024-01-31 NOTE — Telephone Encounter (Signed)
 It has been sent to scanning.

## 2024-02-05 ENCOUNTER — Telehealth: Payer: Self-pay | Admitting: Lab

## 2024-02-05 NOTE — Telephone Encounter (Signed)
 Patient states the appt for neuro is to far out wants you to put urgent referral stat says her situation is an emergency. I explained to the patient that we send referral with records and the providers review records and schedule when they are able to see you. She says it's an emergency to get an appt now.
